# Patient Record
Sex: Female | Born: 1955 | Race: White | Hispanic: No | Smoking: Former smoker
Health system: Southern US, Community
[De-identification: ages and names within clinical notes are randomized; demographics above are authoritative.]

## PROBLEM LIST (undated history)

## (undated) DIAGNOSIS — K5792 Diverticulitis of intestine, part unspecified, without perforation or abscess without bleeding: Secondary | ICD-10-CM

## (undated) DIAGNOSIS — F330 Major depressive disorder, recurrent, mild: Secondary | ICD-10-CM

## (undated) DIAGNOSIS — J45909 Unspecified asthma, uncomplicated: Secondary | ICD-10-CM

## (undated) DIAGNOSIS — E8940 Asymptomatic postprocedural ovarian failure: Secondary | ICD-10-CM

## (undated) DIAGNOSIS — K219 Gastro-esophageal reflux disease without esophagitis: Secondary | ICD-10-CM

## (undated) DIAGNOSIS — M1990 Unspecified osteoarthritis, unspecified site: Secondary | ICD-10-CM

## (undated) DIAGNOSIS — F419 Anxiety disorder, unspecified: Secondary | ICD-10-CM

## (undated) DIAGNOSIS — R519 Headache, unspecified: Secondary | ICD-10-CM

## (undated) DIAGNOSIS — Z9889 Other specified postprocedural states: Secondary | ICD-10-CM

## (undated) DIAGNOSIS — M069 Rheumatoid arthritis, unspecified: Secondary | ICD-10-CM

## (undated) DIAGNOSIS — K5190 Ulcerative colitis, unspecified, without complications: Secondary | ICD-10-CM

## (undated) DIAGNOSIS — K5790 Diverticulosis of intestine, part unspecified, without perforation or abscess without bleeding: Secondary | ICD-10-CM

## (undated) DIAGNOSIS — E785 Hyperlipidemia, unspecified: Secondary | ICD-10-CM

## (undated) HISTORY — PX: ESOPHAGOGASTRODUODENOSCOPY: SHX1529

## (undated) HISTORY — PX: VAGINAL HYSTERECTOMY: SUR661

## (undated) HISTORY — PX: CARDIAC CATHETERIZATION: SHX172

## (undated) HISTORY — PX: COLONOSCOPY: SHX174

---

## 2004-05-14 ENCOUNTER — Ambulatory Visit: Payer: Self-pay | Admitting: Physician Assistant

## 2004-05-18 ENCOUNTER — Ambulatory Visit: Payer: Self-pay | Admitting: Internal Medicine

## 2004-08-07 ENCOUNTER — Ambulatory Visit: Payer: Self-pay | Admitting: Obstetrics and Gynecology

## 2005-08-27 ENCOUNTER — Ambulatory Visit: Payer: Self-pay | Admitting: Unknown Physician Specialty

## 2005-09-02 ENCOUNTER — Ambulatory Visit: Payer: Self-pay | Admitting: Unknown Physician Specialty

## 2006-04-26 ENCOUNTER — Ambulatory Visit: Payer: Self-pay | Admitting: Internal Medicine

## 2006-05-23 ENCOUNTER — Ambulatory Visit: Payer: Self-pay | Admitting: Psychiatry

## 2007-07-23 ENCOUNTER — Emergency Department: Payer: Self-pay | Admitting: Emergency Medicine

## 2008-08-30 ENCOUNTER — Ambulatory Visit: Payer: Self-pay | Admitting: Cardiology

## 2009-05-12 ENCOUNTER — Observation Stay: Payer: Self-pay | Admitting: Internal Medicine

## 2009-06-19 ENCOUNTER — Ambulatory Visit: Payer: Self-pay | Admitting: Internal Medicine

## 2009-06-25 ENCOUNTER — Ambulatory Visit: Payer: Self-pay | Admitting: Internal Medicine

## 2010-01-20 ENCOUNTER — Ambulatory Visit: Payer: Self-pay | Admitting: Internal Medicine

## 2012-12-15 ENCOUNTER — Ambulatory Visit: Payer: Self-pay | Admitting: Internal Medicine

## 2013-01-05 ENCOUNTER — Inpatient Hospital Stay: Payer: Self-pay | Admitting: Internal Medicine

## 2013-01-05 LAB — CBC WITH DIFFERENTIAL/PLATELET
Basophil #: 0.1 10*3/uL (ref 0.0–0.1)
Basophil %: 0.9 %
Eosinophil #: 0.1 10*3/uL (ref 0.0–0.7)
Eosinophil %: 0.8 %
HCT: 40.4 % (ref 35.0–47.0)
HGB: 13.8 g/dL (ref 12.0–16.0)
Lymphocyte #: 2.2 10*3/uL (ref 1.0–3.6)
Lymphocyte %: 19.2 %
MCH: 32.8 pg (ref 26.0–34.0)
MCHC: 34.2 g/dL (ref 32.0–36.0)
MCV: 96 fL (ref 80–100)
MONOS PCT: 11.3 %
Monocyte #: 1.3 x10 3/mm — ABNORMAL HIGH (ref 0.2–0.9)
Neutrophil #: 7.7 10*3/uL — ABNORMAL HIGH (ref 1.4–6.5)
Neutrophil %: 67.8 %
Platelet: 166 10*3/uL (ref 150–440)
RBC: 4.22 10*6/uL (ref 3.80–5.20)
RDW: 11.9 % (ref 11.5–14.5)
WBC: 11.4 10*3/uL — AB (ref 3.6–11.0)

## 2013-01-05 LAB — COMPREHENSIVE METABOLIC PANEL
ALBUMIN: 3.4 g/dL (ref 3.4–5.0)
ALT: 22 U/L (ref 12–78)
ANION GAP: 3 — AB (ref 7–16)
AST: 21 U/L (ref 15–37)
Alkaline Phosphatase: 99 U/L
BUN: 7 mg/dL (ref 7–18)
Bilirubin,Total: 0.5 mg/dL (ref 0.2–1.0)
CALCIUM: 9.4 mg/dL (ref 8.5–10.1)
CHLORIDE: 105 mmol/L (ref 98–107)
CREATININE: 0.71 mg/dL (ref 0.60–1.30)
Co2: 29 mmol/L (ref 21–32)
EGFR (Non-African Amer.): >60
Glucose: 94 mg/dL (ref 65–99)
Osmolality: 272 (ref 275–301)
Potassium: 3.2 mmol/L — ABNORMAL LOW (ref 3.5–5.1)
SODIUM: 137 mmol/L (ref 136–145)
Total Protein: 7.2 g/dL (ref 6.4–8.2)

## 2013-01-05 LAB — TSH: Thyroid Stimulating Horm: 0.56 u[IU]/mL

## 2013-01-05 LAB — SEDIMENTATION RATE: Erythrocyte Sed Rate: 4 mm/hr (ref 0–30)

## 2013-01-06 LAB — URINALYSIS, COMPLETE
BILIRUBIN, UR: NEGATIVE
Bacteria: NONE SEEN
Glucose,UR: NEGATIVE mg/dL (ref 0–75)
KETONE: NEGATIVE
Leukocyte Esterase: NEGATIVE
Nitrite: NEGATIVE
Ph: 6 (ref 4.5–8.0)
Protein: NEGATIVE
RBC,UR: 4 /HPF (ref 0–5)
SPECIFIC GRAVITY: 1.01 (ref 1.003–1.030)
Squamous Epithelial: 9
WBC UR: <1 /HPF (ref 0–5)

## 2013-01-06 LAB — WBCS, STOOL

## 2013-01-06 LAB — CLOSTRIDIUM DIFFICILE(ARMC)

## 2013-01-07 LAB — CBC WITH DIFFERENTIAL/PLATELET
Basophil #: 0 10*3/uL (ref 0.0–0.1)
Basophil %: 0.7 %
Eosinophil #: 0.2 10*3/uL (ref 0.0–0.7)
Eosinophil %: 3.9 %
HCT: 33.4 % — AB (ref 35.0–47.0)
HGB: 11.4 g/dL — AB (ref 12.0–16.0)
Lymphocyte #: 1.2 10*3/uL (ref 1.0–3.6)
Lymphocyte %: 19.7 %
MCH: 32.6 pg (ref 26.0–34.0)
MCHC: 34.2 g/dL (ref 32.0–36.0)
MCV: 95 fL (ref 80–100)
MONOS PCT: 12.9 %
Monocyte #: 0.8 x10 3/mm (ref 0.2–0.9)
NEUTROS PCT: 62.8 %
Neutrophil #: 3.8 10*3/uL (ref 1.4–6.5)
Platelet: 141 10*3/uL — ABNORMAL LOW (ref 150–440)
RBC: 3.5 10*6/uL — ABNORMAL LOW (ref 3.80–5.20)
RDW: 11.8 % (ref 11.5–14.5)
WBC: 6 10*3/uL (ref 3.6–11.0)

## 2013-01-07 LAB — COMPREHENSIVE METABOLIC PANEL
Albumin: 2.5 g/dL — ABNORMAL LOW (ref 3.4–5.0)
Alkaline Phosphatase: 70 U/L
Anion Gap: 4 — ABNORMAL LOW (ref 7–16)
BUN: 5 mg/dL — AB (ref 7–18)
Bilirubin,Total: 0.3 mg/dL (ref 0.2–1.0)
CALCIUM: 8.4 mg/dL — AB (ref 8.5–10.1)
CHLORIDE: 112 mmol/L — AB (ref 98–107)
Co2: 25 mmol/L (ref 21–32)
Creatinine: 0.73 mg/dL (ref 0.60–1.30)
EGFR (Non-African Amer.): >60
Glucose: 87 mg/dL (ref 65–99)
OSMOLALITY: 278 (ref 275–301)
Potassium: 3.6 mmol/L (ref 3.5–5.1)
SGOT(AST): 22 U/L (ref 15–37)
SGPT (ALT): 16 U/L (ref 12–78)
Sodium: 141 mmol/L (ref 136–145)
Total Protein: 5.5 g/dL — ABNORMAL LOW (ref 6.4–8.2)

## 2013-01-07 LAB — OCCULT BLOOD X 1 CARD TO LAB, STOOL: Occult Blood, Feces: NEGATIVE

## 2013-01-08 LAB — COMPREHENSIVE METABOLIC PANEL
ALK PHOS: 72 U/L
ALT: 16 U/L (ref 12–78)
Albumin: 2.5 g/dL — ABNORMAL LOW (ref 3.4–5.0)
Anion Gap: 4 — ABNORMAL LOW (ref 7–16)
BILIRUBIN TOTAL: 0.4 mg/dL (ref 0.2–1.0)
BUN: 6 mg/dL — ABNORMAL LOW (ref 7–18)
CHLORIDE: 111 mmol/L — AB (ref 98–107)
CO2: 25 mmol/L (ref 21–32)
CREATININE: 0.72 mg/dL (ref 0.60–1.30)
Calcium, Total: 8.5 mg/dL (ref 8.5–10.1)
EGFR (Non-African Amer.): >60
Glucose: 88 mg/dL (ref 65–99)
Osmolality: 276 (ref 275–301)
Potassium: 3.6 mmol/L (ref 3.5–5.1)
SGOT(AST): 18 U/L (ref 15–37)
Sodium: 140 mmol/L (ref 136–145)
Total Protein: 5.5 g/dL — ABNORMAL LOW (ref 6.4–8.2)

## 2013-01-08 LAB — STOOL CULTURE

## 2013-01-08 LAB — CBC WITH DIFFERENTIAL/PLATELET
BASOS PCT: 0.9 %
Basophil #: 0.1 10*3/uL (ref 0.0–0.1)
EOS ABS: 0.3 10*3/uL (ref 0.0–0.7)
EOS PCT: 5.2 %
HCT: 34.2 % — AB (ref 35.0–47.0)
HGB: 11.7 g/dL — ABNORMAL LOW (ref 12.0–16.0)
Lymphocyte #: 1.9 10*3/uL (ref 1.0–3.6)
Lymphocyte %: 30.7 %
MCH: 33.1 pg (ref 26.0–34.0)
MCHC: 34.3 g/dL (ref 32.0–36.0)
MCV: 97 fL (ref 80–100)
MONO ABS: 0.8 x10 3/mm (ref 0.2–0.9)
MONOS PCT: 12.5 %
Neutrophil #: 3.1 10*3/uL (ref 1.4–6.5)
Neutrophil %: 50.7 %
PLATELETS: 147 10*3/uL — AB (ref 150–440)
RBC: 3.55 10*6/uL — AB (ref 3.80–5.20)
RDW: 11.7 % (ref 11.5–14.5)
WBC: 6.1 10*3/uL (ref 3.6–11.0)

## 2013-01-09 LAB — PATHOLOGY REPORT

## 2013-01-31 ENCOUNTER — Other Ambulatory Visit: Payer: Self-pay | Admitting: Gastroenterology

## 2013-01-31 LAB — CLOSTRIDIUM DIFFICILE(ARMC)

## 2013-02-03 LAB — STOOL CULTURE

## 2013-03-30 ENCOUNTER — Ambulatory Visit: Payer: Self-pay | Admitting: Gastroenterology

## 2013-04-02 LAB — PATHOLOGY REPORT

## 2014-03-06 ENCOUNTER — Ambulatory Visit: Payer: Self-pay | Admitting: Obstetrics and Gynecology

## 2014-04-19 ENCOUNTER — Other Ambulatory Visit: Payer: Self-pay | Admitting: Obstetrics and Gynecology

## 2014-04-19 DIAGNOSIS — N63 Unspecified lump in unspecified breast: Secondary | ICD-10-CM

## 2014-04-27 NOTE — H&P (Signed)
PATIENT NAME:  Nicole Turner, Nicole Turner MR#:  [id number] DATE OF BIRTH:  1955-01-30  DATE OF ADMISSION:  01/05/2013  HISTORY OF PRESENT ILLNESS: The patient is a 59 year old female presenting with lower GI bleed and severe abdominal pain with inflammatory colitis, unresponsive to outpatient therapy. She has been sick for essentially 1 month. She saw me approximately 1 month ago with abdominal pain, diarrhea, some blood in it. At that time, C. diff was negative. An abdominopelvic CT scan was unremarkable but her symptoms were profound. She was placed on Levaquin and Flagyl for 10 days with low-dose prednisone. Her symptoms improved slightly with b.i.d. Protonix as well;  however, they persisted. Carafate was added to help her ameliorate her symptomatology. However, now, 1 month later, she has lost 10 to 15 pounds. She is hardly eating anything. All stools are mixed with blood. No fever or chills. Profound abdominal pain noted. She is very lightheaded when she stands up. Can really not keep anything down hardly.   In light of her progressive decline and symptoms as above, unresponsive to outpatient therapy, she is being admitted for further evaluation and treatment.   PAST MEDICAL HISTORY AND MEDICAL ILLNESSES:  1.  Mitral valve prolapse/MR.  2.  History of daily headaches.  3.  Rheumatoid arthritis, erosive, status post methotrexate.  4.  History of osteoarthritis.     PAST SURGICAL HISTORY: Hysterectomy, BTL.   ALLERGIES: SALICYLATES, TRAMADOL.   MEDICATIONS: Enjuvia 1.25 mg daily, lovastatin 20 mg daily, citalopram 20 mg daily.   SOCIAL HISTORY: Remarried. No smoking or alcohol.   FAMILY HISTORY: Noncontributory.   REVIEW OF SYSTEMS: As noted above.   PHYSICAL EXAMINATION:  VITAL SIGNS: Blood pressure 110/60 lying, 90/50 standing. Pulse goes from 80 to 100. Weight is 135, down 10 pounds from a month ago.  NECK: No thyromegaly or bruits.  HEART: Regular rate and rhythm.  No audible murmur.   LUNGS: Clear to auscultation and percussion.  ABDOMEN: Severe diffuse tenderness, mostly in the left lower quadrant, questionable rebound. Soft.  EXTREMITIES: No edema.  SKIN: No rash.  NEUROLOGIC: Grossly nonfocal.   ASSESSMENT AND PLAN:  1.  Severe abdominal pain - associated inflammatory colitis with gastrointestinal bleed. Consult gastroenterology. Likely needs colonoscopy. Send off stool for culture and sensitivity. WBC, Clostridium difficile, toxin, ova and parasites. Empiric Levaquin and Flagyl. Abdominal pelvic CT with contrast to evaluate further.  2.  Volume depletion - IV normal saline in light of her orthostatic symptoms.  3.  Profound epigastric pain - IV Protonix. Ultimately she may need steroids but will see gastroenterology's  thoughts. Very well could be either infectious versus autoimmune inflammatory colitis.   ____________________________ Danella Penton, MD mfm:np D: 01/05/2013 18:03:17 ET T: 01/05/2013 18:22:59 ET JOB#: [id number]  cc: Danella Penton, MD, <Dictator> Khylan Sawyer Sherlene Shams MD ELECTRONICALLY SIGNED 01/08/2013 8:18

## 2014-04-27 NOTE — Consult Note (Signed)
Chief Complaint:  Subjective/Chief Complaint continues with diarrhea though less frequent, no n/v, tolerating clears.   VITAL SIGNS/ANCILLARY NOTES: **Vital Signs.:   04-Jan-15 05:55  Vital Signs Type Routine  Temperature Temperature (F) 98.3  Celsius 36.8  Temperature Source oral  Pulse Pulse 67  Respirations Respirations 20  Systolic BP Systolic BP 161  Diastolic BP (mmHg) Diastolic BP (mmHg) 63  Mean BP 76  Pulse Ox % Pulse Ox % 94  Pulse Ox Activity Level  At rest  Oxygen Delivery Room Air/ 21 %  *Intake and Output.:   04-Jan-15 01:19  Stool  Small watery reddish stool per pt.    06:00  Stool  Watery stool per pt.   Brief Assessment:  Cardiac Regular   Respiratory clear BS   Gastrointestinal details normal Soft  Bowel sounds normal  No rebound tenderness  mild tenderness to palpation on the left abdomen, mostly llq   Additional Physical Exam DRE-heme negative clear/white mucus.   Lab Results: Hepatic:  04-Jan-15 05:58   Bilirubin, Total 0.3  Alkaline Phosphatase 70 (45-117 NOTE: New Reference Range 11/24/12)  SGPT (ALT) 16  SGOT (AST) 22  Total Protein, Serum  5.5  Albumin, Serum  2.5  Routine Micro:  02-Jan-15 21:07   Micro Text Report CLOSTRIDIUM DIFFICILE   C.DIFFICILE ANTIGEN       C.DIFFICILE GDH ANTIGEN : POSITIVE   C.DIFFICILE TOXIN A/B     C.DIFFICILE TOXINS A AND B : NEGATIVE   PCR FOR TOXIGENIC C.DIFF  PCR FOR TOXIGENIC C.DIFFICILE : POSITIVE   INTERPRETATION Positive for toxigenic C. difficile, active toxin production NOT detected.  Patient has toxigenic C. difficile organisms present in the bowel, but toxin was not detected.  The patient may be a carrier or the level of toxin in the sample was below the limit of detection. This information should be used in conjunction with the patient's clinical history when deciding on possible therapy.   ANTIBIOTIC                       Routine Chem:  04-Jan-15 05:58   Glucose, Serum 87  BUN  5   Creatinine (comp) 0.73  Sodium, Serum 141  Potassium, Serum 3.6  Chloride, Serum  112  CO2, Serum 25  Calcium (Total), Serum  8.4  Osmolality (calc) 278  eGFR (African American) >60  eGFR (Non-African American) >60 (eGFR values <57m/min/1.73 m2 may be an indication of chronic kidney disease (CKD). Calculated eGFR is useful in patients with stable renal function. The eGFR calculation will not be reliable in acutely ill patients when serum creatinine is changing rapidly. It is not useful in  patients on dialysis. The eGFR calculation may not be applicable to patients at the low and high extremes of body sizes, pregnant women, and vegetarians.)  Anion Gap  4  Routine Hem:  04-Jan-15 05:58   WBC (CBC) 6.0  RBC (CBC)  3.50  Hemoglobin (CBC)  11.4  Hematocrit (CBC)  33.4  Platelet Count (CBC)  141  MCV 95  MCH 32.6  MCHC 34.2  RDW 11.8  Neutrophil % 62.8  Lymphocyte % 19.7  Monocyte % 12.9  Eosinophil % 3.9  Basophil % 0.7  Neutrophil # 3.8  Lymphocyte # 1.2  Monocyte # 0.8  Eosinophil # 0.2  Basophil # 0.0 (Result(s) reported on 07 Jan 2013 at 06:19AM.)   Radiology Results: CT:    02-Jan-15 21:49, CT Abdomen and Pelvis With Contrast  CT Abdomen  and Pelvis With Contrast   REASON FOR EXAM:    (1) DIFFUSE SEVERE PAIN; (2) DIFFUSE SEVERE PAIN  COMMENTS:       PROCEDURE: CT  - CT ABDOMEN / PELVIS  W  - Jan 05 2013  9:49PM     CLINICAL DATA:  Abdominal pain and GI bleeding.    EXAM:  CT ABDOMEN AND PELVIS WITH CONTRAST    TECHNIQUE:  Multidetector CT imaging of the abdomen and pelvis was performed  using the standard protocol following bolus administration of  intravenous contrast.  CONTRAST:  100 cc Isovue 370 intravenous    COMPARISON:  12/15/2012    FINDINGS:  BODY WALL: Unremarkable.    LOWER CHEST: RCA atherosclerosis.    ABDOMEN/PELVIS:    Liver: No focal abnormality.    Biliary: No evidence of biliary obstruction or stone.  Pancreas:  Unremarkable.    Spleen: Unremarkable.    Adrenals: Unremarkable.    Kidneys and ureters: No hydronephrosis or stone.    Bladder: Unremarkable.    Reproductive: Hysterectomy.    Bowel: Distal colonic diverticulosis, which may account for mild  diffuse sigmoid wall thickening. No bowel obstruction. Normal  appendix.  Retroperitoneum: No mass or adenopathy.    Peritoneum: No free fluid or gas.    Vascular: No acute abnormality.    OSSEOUS: No acute abnormalities.     IMPRESSION:  1. No evidence of acute intra-abdominal disease.  2. Colonic diverticulosis.  3. Atherosclerosis, including the coronary arteries.      Electronically Signed    By: Jorje Guild M.D.    On: 01/06/2013 01:24         Verified By: Gilford Silvius, M.D.,   Assessment/Plan:  Assessment/Plan:  Assessment 1) diarrheal illness-positive c. diff., on po flagyl and levoquin.  d/c levoquin continue flagyl.  will arrange for flexible sigmoidoscopy tomorrow am.  I have discussed the risks benefits and complicatiosn of flexible sigmoidoscopy to include not limited to bleeding infection perforation and sedation and she wishes to proceed.   Plan as above.   Electronic Signatures: Loistine Simas (MD)  (Signed 04-Jan-15 14:00)  Authored: Chief Complaint, VITAL SIGNS/ANCILLARY NOTES, Brief Assessment, Lab Results, Radiology Results, Assessment/Plan   Last Updated: 04-Jan-15 14:00 by Loistine Simas (MD)

## 2014-04-27 NOTE — Consult Note (Signed)
PATIENT NAME:  Nicole Turner, Nicole Turner MR#:  [id number] DATE OF BIRTH:  01-Sep-1955  DATE OF CONSULTATION:  01/06/2013  REFERRING PHYSICIAN:   CONSULTING PHYSICIAN:  Christena Deem, MD  HER PHYSICIANS:  Bethann Punches, MD and Aletha Halim, MD  REASON FOR CONSULTATION:  Abdominal pain  rectal bleeding  diarrheal illness.   HISTORY OF PRESENT ILLNESS:  Nicole Turner is a pleasant 59 year old Caucasian female who was in her usual state of health until about 6 weeks ago. At that time she stated she had an episode of gas cramping and then followed by passing some mucus and blood from her rectum. This was accompanied with some generalized abdominal pain. She went to see her primary physician who placed her on a course of what sounds to be Cipro and Flagyl as well as a low dose of prednisone. She was also placed on a PPI. She seemed to do a bit better, but then a couple of weeks later was treated with another course of antibiotic, she is uncertain what that may have been, for an upper respiratory tract infection. She stated that the diarrhea actually started more so about a week after the initial episode. She has been nauseated but no vomiting. Currently mostly lower abdominal pain over the course of the past month or so. There have been no stool studies done outpatient. Apparently, however, stool studies have been collected on this hospitalization. The culture and sensitivities are pending; however, she is positive for C. difficile. She is currently on Levaquin as well as Flagyl. Currently, she states that she is feeling a little bit better, having started this regimen yesterday. Her lower abdominal pain is about a 4/10, centered mostly in the left lower quadrant. There is no nausea or vomiting. She states her last bowel movement was couple of hours ago, this still being watery. The patient has not had any contact with either nursing home patients, previous C. difficile. 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   GASTROINTESTINAL FAMILY HISTORY:  Negative for colorectal cancer, liver disease or ulcers.   PAST MEDICAL HISTORY:  1.  Mitral valve prolapse/mitral regurgitation. She states that she had 2 episodes of rheumatic fever as a child. She has a history of the heart murmur as noted.  2.  She has a history of daily headaches.  3.  She has rheumatoid arthritis for which she has taken methotrexate in the past. Also a history of osteoarthritis.  4.  A history of hysterectomy and previous to that bilateral tubal ligation.   ALLERGIES:  She is allergic to AMOXICILLIN and TRAMADOL.   OUTPATIENT MEDICATIONS: 1.  Carafate 1 gram t.i.d. 2.  Citalopram 20 mg 1-1/2 tablets once a day. 3.  Enjuvia 1.25 mg tablet once a day. 4.  Lovastatin 40 mg once a day.  5.  Pantoprazole 40 mg once a day.  6.  There is apparently a history of her taking omeprazole as an outpatient. I am uncertain as to how long ago that was.   REVIEW OF SYSTEMS:  Negative with the exception of above.   PHYSICAL EXAMINATION:  VITAL SIGNS: Temperature is 97.7, pulse 72, respirations 18, blood pressure 103/61, pulse ox 93%.  GENERAL:  She is a 59 year old Caucasian female, no acute distress.  HEENT:  Normocephalic, atraumatic.  EYES:  Anicteric.  NOSE:  Septum midline. No lesions.  OROPHARYNX:  No lesions.  NECK:  Supple. No JVD.  HEART:  Regular rate and rhythm.  LUNGS:  Clear.  ABDOMEN:  Soft. There is some mild tenderness to palpation in the left lower quadrant. There are no masses, rebound or organomegaly. Bowel sounds positive, normoactive.  ANORECTAL:  Deferred.  EXTREMITIES:  No clubbing, cyanosis or edema to 0.5 trace edema.  NEUROLOGICAL:  Cranial nerves II through XII grossly intact. Muscle strength bilaterally equal and symmetric. DTRs bilaterally equal and symmetric.   LABORATORIES FROM ADMISSION YESTERDAY AT 1923 HOURS SHOWED:  A glucose of 94, BUN 7, creatinine 0.71, sodium 137, potassium 3.2,  chloride 105, bicarb 29, calcium 9.4. Her hepatic profile was normal. Her TSH was normal at 0.5. Hemogram showing a white count of 11.4, sedimentation rate 4, H and H 13.8/40.4, platelet count 166, MCV is 96. C. diff. positive. Stool culture and sensitivity being held for further evaluation. Urinalysis showed 1+ blood, negative otherwise. She had a CT scan of the abdomen and pelvis with contrast for abdominal pain. This showing no evidence of acute intra-abdominal disease, colonic diverticulosis with some possible mild diffuse sigmoid wall thickening, no evidence of bowel obstruction, normal appendix and some mild atherosclerosis in the coronary arteries.   ASSESSMENT:  Diarrheal illness. The patient has had multiple courses of antibiotics over the past 6 weeks. She is currently Clostridium difficile positive and is on metronidazole. However, she is also on Levaquin. She did have some stool collected for culture on admission and the culture and sensitivity is still pending. She is feeling a bit better than that on admission. It is difficult to tell whether the current situation is connected with her initial presentation or whether the Clostridium difficile is the result of the previous treatment with several courses of antibiotics.   RECOMMENDATIONS:  1.  Continue current antibiotics until we get the cultures back. If the cultures are uninformative otherwise, would discontinue the Levaquin and continue the Flagyl. Would consider changing that to oral 500 mg p.o. t.i.d.  2.  The patient will need a luminal evaluation via colonoscopy at some point; however, with a positive Clostridium difficile, would hold on that for now. If she does not seem to improve over the next couple of days, would consider a flexible sigmoidoscopy.   Addendum:  In review, the patient had an EGD and colonoscopy on 09/02/2005. The EGD being done for epigastric pain and dysphagia with findings of hiatal hernia, some mild gastritis and  esophagitis. Apparently, that was what she was put on the Prilosec for years ago. Her colonoscopy was done for rectal bleeding with the finding of diverticulosis and internal hemorrhoids, negative otherwise. The biopsies were negative from both the upper and the lower for other causes.   Will follow with you. Thank you for this consult.   ____________________________ Christena Deem, MD mus:jm D: 01/06/2013 12:59:00 ET T: 01/06/2013 13:24:41 ET JOB#: [id number]  cc: Christena Deem, MD, <Dictator> Christena Deem MD ELECTRONICALLY SIGNED 01/06/2013 15:31

## 2014-04-27 NOTE — Consult Note (Signed)
Brief Consult Note: Diagnosis: diarrheal illness, abdominal pain, positive C. diff.   Patient was seen by consultant.   Consult note dictated.   Recommend further assessment or treatment.   Comments: Please see full GI consult [phone].  Patietn admitted with abdominal pain and nausea with copious diarrhea.  Found to have positive C. diff organism but not toxin.  Currently felling a little better on levaquin and flagyl.  Stool culture pending result for other causative etiology.  Last colonoscopy 08/2005 with finding of sigmoid diverticulosis and internal hemorrhoids.  Etiology of current illness probably c. diff after several courses of antibiotics, although the initial presentation may not have been (question of diverticulitis? at that time).  Continue current treatment and await final on stool cultures.  If no other organism, discontinue the levaquin, continue flagyl, change to po.  Patient will need luminal evaluation via colonoscopy, but would be better to wait resolution of this illness episode.  If there is no improvement, will do flex sig on monday or so.   Following.  Electronic Signatures: Barnetta Chapel (MD)  (Signed 03-Jan-15 13:06)  Authored: Brief Consult Note   Last Updated: 03-Jan-15 13:06 by Barnetta Chapel (MD)

## 2014-04-27 NOTE — Discharge Summary (Signed)
PATIENT NAME:  Nicole Turner, Nicole Turner MR#:  [id number] DATE OF BIRTH:  1955/09/07  DATE OF ADMISSION:  01/05/2013 DATE OF DISCHARGE:  01/09/2013  DISCHARGE DIAGNOSES:  1.  Pseudomembranous colitis.  2.  Dehydration.  3.  Erosive rheumatoid arthritis.  4.  Gastritis.   DISCHARGE MEDICATIONS: Enjuvia 1.25 mg daily, lovastatin 40 mg at bedtime, Pantoprazole 40 mg daily, metronidazole 500 mg t.i.d. x 10 days and Imipramine 25 mg at bedtime and Florastor 250 mg b.i.d.   REASON FOR ADMISSION: A 59 year old female presents with severe abdominal pain and bloody diarrhea. Please see H and P for HPI, past medical history and physical exam.   HOSPITAL COURSE: The patient was admitted, hydrated aggressively, found to be C. difficile positive, was treated with IV then p.o. Flagyl. Her symptoms did improve. Sigmoidoscopy was consistent with pseudomembranous colitis. She will be on 10 days of Flagyl. Ultimately wean that off. Continue the Florastor. Will follow up with Dr. Marva Panda in 2 weeks for consideration of full colonoscopy and endoscopy.  ____________________________ Danella Penton, MD mfm:aw D: 01/09/2013 07:26:51 ET T: 01/09/2013 07:55:55 ET JOB#: [id number]  cc: Danella Penton, MD, <Dictator> Rhiannan Kievit Sherlene Shams MD ELECTRONICALLY SIGNED 01/10/2013 8:04

## 2014-04-27 NOTE — Consult Note (Signed)
Chief Complaint:  Subjective/Chief Complaint seen for abdominal pain and diarrheal illness.  feelng some better today, no n/v  abd pain is 4/10 improving.  no bm since yesterday.   VITAL SIGNS/ANCILLARY NOTES: **Vital Signs.:   05-Jan-15 04:00  Vital Signs Type Routine  Temperature Temperature (F) 97.8  Celsius 36.5  Temperature Source oral  Pulse Pulse 55  Respirations Respirations 20  Systolic BP Systolic BP 295  Diastolic BP (mmHg) Diastolic BP (mmHg) 71  Mean BP 87  Pulse Ox % Pulse Ox % 92  Pulse Ox Activity Level  At rest  Oxygen Delivery Room Air/ 21 %   Brief Assessment:  Cardiac Regular   Respiratory clear BS   Gastrointestinal details normal Soft  Nontender  Nondistended  No masses palpable  Bowel sounds normal   Lab Results: Hepatic:  05-Jan-15 03:48   Bilirubin, Total 0.4  Alkaline Phosphatase 72 (45-117 NOTE: New Reference Range 11/24/12)  SGPT (ALT) 16  SGOT (AST) 18  Total Protein, Serum  5.5  Albumin, Serum  2.5  Routine Chem:  05-Jan-15 03:48   Glucose, Serum 88  BUN  6  Creatinine (comp) 0.72  Sodium, Serum 140  Potassium, Serum 3.6  Chloride, Serum  111  CO2, Serum 25  Calcium (Total), Serum 8.5  Osmolality (calc) 276  eGFR (African American) >60  eGFR (Non-African American) >60 (eGFR values <38m/min/1.73 m2 may be an indication of chronic kidney disease (CKD). Calculated eGFR is useful in patients with stable renal function. The eGFR calculation will not be reliable in acutely ill patients when serum creatinine is changing rapidly. It is not useful in  patients on dialysis. The eGFR calculation may not be applicable to patients at the low and high extremes of body sizes, pregnant women, and vegetarians.)  Anion Gap  4  Routine Sero:  04-Jan-15 13:09   Occult Blood, Feces NEGATIVE (Result(s) reported on 07 Jan 2013 at 02:13PM.)  Routine Hem:  05-Jan-15 03:48   WBC (CBC) 6.1  RBC (CBC)  3.55  Hemoglobin (CBC)  11.7  Hematocrit  (CBC)  34.2  Platelet Count (CBC)  147  MCV 97  MCH 33.1  MCHC 34.3  RDW 11.7  Neutrophil % 50.7  Lymphocyte % 30.7  Monocyte % 12.5  Eosinophil % 5.2  Basophil % 0.9  Neutrophil # 3.1  Lymphocyte # 1.9  Monocyte # 0.8  Eosinophil # 0.3  Basophil # 0.1 (Result(s) reported on 08 Jan 2013 at 04:40AM.)   Radiology Results: CT:    02-Jan-15 21:49, CT Abdomen and Pelvis With Contrast  CT Abdomen and Pelvis With Contrast   REASON FOR EXAM:    (1) DIFFUSE SEVERE PAIN; (2) DIFFUSE SEVERE PAIN  COMMENTS:       PROCEDURE: CT  - CT ABDOMEN / PELVIS  W  - Jan 05 2013  9:49PM     CLINICAL DATA:  Abdominal pain and GI bleeding.    EXAM:  CT ABDOMEN AND PELVIS WITH CONTRAST    TECHNIQUE:  Multidetector CT imaging of the abdomen and pelvis was performed  using the standard protocol following bolus administration of  intravenous contrast.  CONTRAST:  100 cc Isovue 370 intravenous    COMPARISON:  12/15/2012    FINDINGS:  BODY WALL: Unremarkable.    LOWER CHEST: RCA atherosclerosis.    ABDOMEN/PELVIS:    Liver: No focal abnormality.    Biliary: No evidence of biliary obstruction or stone.  Pancreas: Unremarkable.    Spleen: Unremarkable.  Adrenals: Unremarkable.    Kidneys and ureters: No hydronephrosis or stone.    Bladder: Unremarkable.    Reproductive: Hysterectomy.    Bowel: Distal colonic diverticulosis, which may account for mild  diffuse sigmoid wall thickening. No bowel obstruction. Normal  appendix.  Retroperitoneum: No mass or adenopathy.    Peritoneum: No free fluid or gas.    Vascular: No acute abnormality.    OSSEOUS: No acute abnormalities.     IMPRESSION:  1. No evidence of acute intra-abdominal disease.  2. Colonic diverticulosis.  3. Atherosclerosis, including the coronary arteries.      Electronically Signed    By: Jorje Guild M.D.    On: 01/06/2013 01:24         Verified By: Gilford Silvius, M.D.,   Assessment/Plan:   Assessment/Plan:  Assessment 1) diarrheal illness, previous rectal bleeding.  positive c diff.   Plan 1) flexible sigmoidoscopy today.  I have discussed the risks benefits and complications of flexible sigmoidoscopy to include not limited to bleeding infection perforation and sedation and she wishes to proceed.  further recs to follow.   Electronic Signatures: Loistine Simas (MD)  (Signed 05-Jan-15 10:29)  Authored: Chief Complaint, VITAL SIGNS/ANCILLARY NOTES, Brief Assessment, Lab Results, Radiology Results, Assessment/Plan   Last Updated: 05-Jan-15 10:29 by Loistine Simas (MD)

## 2014-09-10 ENCOUNTER — Other Ambulatory Visit: Payer: Self-pay

## 2015-10-30 ENCOUNTER — Ambulatory Visit
Admission: RE | Admit: 2015-10-30 | Discharge: 2015-10-30 | Disposition: A | Discharge status: Home / Self Care | Payer: BC Managed Care – PPO | Source: Ambulatory Visit | Attending: Internal Medicine | Admitting: Internal Medicine

## 2015-10-30 ENCOUNTER — Other Ambulatory Visit: Payer: Self-pay | Admitting: Internal Medicine

## 2015-10-30 DIAGNOSIS — I700 Atherosclerosis of aorta: Secondary | ICD-10-CM | POA: Diagnosis not present

## 2015-10-30 DIAGNOSIS — R1084 Generalized abdominal pain: Secondary | ICD-10-CM

## 2015-10-30 DIAGNOSIS — I2510 Atherosclerotic heart disease of native coronary artery without angina pectoris: Secondary | ICD-10-CM | POA: Insufficient documentation

## 2015-10-30 DIAGNOSIS — K529 Noninfective gastroenteritis and colitis, unspecified: Secondary | ICD-10-CM | POA: Insufficient documentation

## 2015-10-30 HISTORY — DX: Unspecified asthma, uncomplicated: J45.909

## 2015-10-30 MED ORDER — IOPAMIDOL (ISOVUE-300) INJECTION 61%
100.0000 mL | Freq: Once | INTRAVENOUS | Status: AC | PRN
  Administered 2015-10-30: 100 mL via INTRAVENOUS

## 2015-11-19 ENCOUNTER — Encounter: Payer: Self-pay | Admitting: *Deleted

## 2015-11-20 ENCOUNTER — Telehealth (INDEPENDENT_AMBULATORY_CARE_PROVIDER_SITE_OTHER): Payer: Self-pay | Admitting: Vascular Surgery

## 2015-11-20 ENCOUNTER — Ambulatory Visit: Payer: BC Managed Care – PPO | Admitting: Anesthesiology

## 2015-11-20 ENCOUNTER — Encounter
Admission: RE | Disposition: A | Discharge status: Home / Self Care | Payer: Self-pay | Source: Ambulatory Visit | Attending: Gastroenterology

## 2015-11-20 ENCOUNTER — Ambulatory Visit
Admission: RE | Admit: 2015-11-20 | Discharge: 2015-11-20 | Disposition: A | Discharge status: Home / Self Care | Payer: BC Managed Care – PPO | Source: Ambulatory Visit | Attending: Gastroenterology | Admitting: Gastroenterology

## 2015-11-20 ENCOUNTER — Encounter: Payer: Self-pay | Admitting: *Deleted

## 2015-11-20 DIAGNOSIS — K641 Second degree hemorrhoids: Secondary | ICD-10-CM | POA: Diagnosis not present

## 2015-11-20 DIAGNOSIS — I2510 Atherosclerotic heart disease of native coronary artery without angina pectoris: Secondary | ICD-10-CM | POA: Insufficient documentation

## 2015-11-20 DIAGNOSIS — E785 Hyperlipidemia, unspecified: Secondary | ICD-10-CM | POA: Insufficient documentation

## 2015-11-20 DIAGNOSIS — J45909 Unspecified asthma, uncomplicated: Secondary | ICD-10-CM | POA: Diagnosis not present

## 2015-11-20 DIAGNOSIS — K625 Hemorrhage of anus and rectum: Secondary | ICD-10-CM | POA: Insufficient documentation

## 2015-11-20 DIAGNOSIS — M1990 Unspecified osteoarthritis, unspecified site: Secondary | ICD-10-CM | POA: Insufficient documentation

## 2015-11-20 DIAGNOSIS — K644 Residual hemorrhoidal skin tags: Secondary | ICD-10-CM | POA: Insufficient documentation

## 2015-11-20 DIAGNOSIS — K5730 Diverticulosis of large intestine without perforation or abscess without bleeding: Secondary | ICD-10-CM | POA: Diagnosis not present

## 2015-11-20 DIAGNOSIS — Z79899 Other long term (current) drug therapy: Secondary | ICD-10-CM | POA: Insufficient documentation

## 2015-11-20 DIAGNOSIS — K649 Unspecified hemorrhoids: Secondary | ICD-10-CM

## 2015-11-20 HISTORY — PX: COLONOSCOPY WITH PROPOFOL: SHX5780

## 2015-11-20 HISTORY — DX: Unspecified hemorrhoids: K64.9

## 2015-11-20 HISTORY — DX: Hyperlipidemia, unspecified: E78.5

## 2015-11-20 HISTORY — DX: Asymptomatic postprocedural ovarian failure: E89.40

## 2015-11-20 HISTORY — DX: Unspecified osteoarthritis, unspecified site: M19.90

## 2015-11-20 LAB — CBC WITH DIFFERENTIAL/PLATELET
BASOS ABS: 0 10*3/uL (ref 0–0.1)
BASOS PCT: 1 %
EOS ABS: 0.2 10*3/uL (ref 0–0.7)
EOS PCT: 4 %
HCT: 39.4 % (ref 35.0–47.0)
Hemoglobin: 13.3 g/dL (ref 12.0–16.0)
Lymphocytes Relative: 32 %
Lymphs Abs: 1.7 10*3/uL (ref 1.0–3.6)
MCH: 32.4 pg (ref 26.0–34.0)
MCHC: 33.7 g/dL (ref 32.0–36.0)
MCV: 96.4 fL (ref 80.0–100.0)
MONO ABS: 0.7 10*3/uL (ref 0.2–0.9)
MONOS PCT: 13 %
Neutro Abs: 2.8 10*3/uL (ref 1.4–6.5)
Neutrophils Relative %: 50 %
PLATELETS: 173 10*3/uL (ref 150–440)
RBC: 4.09 MIL/uL (ref 3.80–5.20)
RDW: 12.9 % (ref 11.5–14.5)
WBC: 5.5 10*3/uL (ref 3.6–11.0)

## 2015-11-20 SURGERY — COLONOSCOPY WITH PROPOFOL
Anesthesia: General

## 2015-11-20 MED ORDER — PROPOFOL 10 MG/ML IV BOLUS
INTRAVENOUS | Status: DC | PRN
  Administered 2015-11-20: 50 mg via INTRAVENOUS

## 2015-11-20 MED ORDER — SODIUM CHLORIDE 0.9 % IV SOLN
INTRAVENOUS | Status: DC
Start: 2015-11-20 — End: 2015-11-20
  Administered 2015-11-20: 14:00:00 via INTRAVENOUS
  Administered 2015-11-20: 1000 mL via INTRAVENOUS
  Administered 2015-11-20: 14:00:00 via INTRAVENOUS

## 2015-11-20 MED ORDER — PHENYLEPHRINE HCL 10 MG/ML IJ SOLN
INTRAMUSCULAR | Status: DC | PRN
  Administered 2015-11-20: 200 ug via INTRAVENOUS
  Administered 2015-11-20 (×2): 100 ug via INTRAVENOUS

## 2015-11-20 MED ORDER — LIDOCAINE 2% (20 MG/ML) 5 ML SYRINGE
INTRAMUSCULAR | Status: DC | PRN
  Administered 2015-11-20: 60 mg via INTRAVENOUS

## 2015-11-20 MED ORDER — PROPOFOL 500 MG/50ML IV EMUL
INTRAVENOUS | Status: DC | PRN
  Administered 2015-11-20: 140 ug/kg/min via INTRAVENOUS

## 2015-11-20 MED ORDER — EPHEDRINE SULFATE 50 MG/ML IJ SOLN
INTRAMUSCULAR | Status: DC | PRN
  Administered 2015-11-20: 10 mg via INTRAVENOUS

## 2015-11-20 NOTE — H&P (Signed)
Outpatient short stay form Pre-procedure 11/20/2015 3:34 PM Nicole Deem MD  Primary Physician: Dr. Bethann Punches  Reason for visit:  Colonoscopy  History of present illness:  Patient is a 60 year old female presenting today as above. She has a recent history about 2 weeks ago of an episode of abdominal pain and rectal bleeding. She has had several further episodes of rectal bleeding since then. Her abdominal pain was generalized initially but continues in the upper abdomen. He did have a CT scan on 10/30/2015 showing a long segment wall thickening involving the descending and transverse colon. The descending and sigmoid were mildly thick-walled although under distended. There was some left colonic diverticulosis without diverticulitis. She was also noted to have some agent Faylene Million three-vessel coronary atherosclerosis. Reading of the CT scan noted atherosclerotic calcifications of the abdominal aorta and branch vessels. There is no comment on the mesenteric specifically. I did discuss with radiology today these findings and there is note of probable 50% or greater at the SMA and celiac origins. She tolerated her prep well. She takes no aspirin or blood thinning agents. She was treated with a course of Flagyl and Cipro at the time for acute illness. Of note she has a history of an episode of abdominal pain and rectal bleeding about 2 years ago biopsy showing some evidence of colonic ischemia.    Current Facility-Administered Medications:  .  0.9 %  sodium chloride infusion, , Intravenous, Continuous, Nicole Deem, MD, Last Rate: 20 mL/hr at 11/20/15 1322  Prescriptions Prior to Admission  Medication Sig Dispense Refill Last Dose  . budesonide-formoterol (SYMBICORT) 80-4.5 MCG/ACT inhaler Inhale 2 puffs into the lungs 2 (two) times daily.   Past Week at Unknown time  . citalopram (CELEXA) 20 MG tablet Take 20 mg by mouth daily.   11/19/2015 at Unknown time  . estradiol (ESTRACE) 2 MG tablet  Take 2 mg by mouth daily.   Past Week at Unknown time  . furosemide (LASIX) 20 MG tablet Take 20 mg by mouth.   Past Week at Unknown time  . lovastatin (MEVACOR) 20 MG tablet Take 20 mg by mouth at bedtime.   Past Week at Unknown time  . montelukast (SINGULAIR) 10 MG tablet Take 10 mg by mouth at bedtime.   Past Week at Unknown time  . triamcinolone (NASACORT ALLERGY 24HR) 55 MCG/ACT AERO nasal inhaler Place 2 sprays into the nose daily.   Past Week at Unknown time     Allergies  Allergen Reactions  . Salicylates   . Tramadol      Past Medical History:  Diagnosis Date  . Asthma   . Hyperlipidemia   . Osteoarthritis   . Surgical menopause     Review of systems:      Physical Exam    Heart and lungs: Regular rate and rhythm without rub or gallop, lungs are bilaterally clear.    HEENT: Normocephalic atraumatic eyes are anicteric    Other:     Pertinant exam for procedure: Soft mild tenderness to palpation across the upper abdomen. Particularly right upper abdomen. There are no masses or rebound.    Planned proceedures: Colonoscopy and indicated procedures. I have discussed the risks benefits and complications of procedures to include not limited to bleeding, infection, perforation and the risk of sedation and the patient wishes to proceed.    Nicole Deem, MD Gastroenterology 11/20/2015  3:34 PM

## 2015-11-20 NOTE — Transfer of Care (Signed)
Immediate Anesthesia Transfer of Care Note  Patient: Nicole Turner  Procedure(s) Performed: Procedure(s): COLONOSCOPY WITH PROPOFOL (N/A)  Patient Location: Endoscopy Unit  Anesthesia Type:General  Level of Consciousness: awake, alert , oriented and patient cooperative  Airway & Oxygen Therapy: Patient Spontanous Breathing and Patient connected to nasal cannula oxygen  Post-op Assessment: Report given to RN, Post -op Vital signs reviewed and stable and Patient moving all extremities X 4  Post vital signs: Reviewed and stable  Last Vitals:  Vitals:   11/20/15 1307  BP: 138/65  Pulse: 60  Resp: 16  Temp: 36.6 C    Last Pain:  Vitals:   11/20/15 1307  TempSrc: Tympanic         Complications: No apparent anesthesia complications

## 2015-11-20 NOTE — Telephone Encounter (Signed)
Received a call from Festus Barren that patient needs to be put on his schedule for 8:30 am tomorrow to be seen for colitis. Referred by [facility] GI. Patient did not answer the phone when I called to schedule.

## 2015-11-20 NOTE — Op Note (Signed)
[facility] Gastroenterology Patient Name: Nicole Turner Procedure Date: 11/20/2015 8:27 AM MRN: [id number] Account #: [id number] Date of Birth: [birth year] Admit Type: Outpatient Age: 60 Room: [facility] ENDO ROOM 1 Gender: Female Note Status: Finalized Procedure:            Colonoscopy Indications:          Generalized abdominal pain, Rectal bleeding Providers:            Christena Deem, MD Referring MD:         Danella Penton, MD (Referring MD) Medicines:            Monitored Anesthesia Care Complications:        No immediate complications. Procedure:            Pre-Anesthesia Assessment:                       - ASA Grade Assessment: II - A patient with mild                        systemic disease.                       After obtaining informed consent, the colonoscope was                        passed under direct vision. Throughout the procedure,                        the patient's blood pressure, pulse, and oxygen                        saturations were monitored continuously. The                        Colonoscope was introduced through the anus and                        advanced to the the cecum, identified by appendiceal                        orifice and ileocecal valve. The colonoscopy was                        performed without difficulty. The patient tolerated the                        procedure well. The colonoscopy was performed without                        difficulty. The patient tolerated the procedure well.                        The quality of the bowel preparation was good. Findings:      Multiple small-mouthed diverticula were found in the sigmoid colon and       distal descending colon.      External (thrombosed) and internal hemorrhoids were found during       retroflexion, during digital exam and during anoscopy. The hemorrhoids       were medium-sized and Grade II (internal hemorrhoids that prolapse but       reduce  spontaneously).      An area of mildly congested slightly scattered erythematous mucosa was       found in the sigmoid colon. Serial biopsies were taken from the cecum,       ascending, transverse descending, sigmoid and rectum.      The appendix showed periappendiceal inflammation with extrusion of       purulent material from the orifice.      No additional abnormalities were found on retroflexion.      The terminal ileum appeared normal. Impression:           - Diverticulosis in the sigmoid colon and in the distal                        descending colon.                       - External and internal hemorrhoids.                       - Congested mucosa in the sigmoid colon.                       - No specimens collected. Recommendation:       - Discharge patient to home.                       - Refer to vascular surgery at appointment to be                        scheduled.                       - Return to GI clinic in 10 days. Procedure Code(s):    --- Professional ---                       [phone], Colonoscopy, flexible; diagnostic, including                        collection of specimen(s) by brushing or washing, when                        performed (separate procedure) Diagnosis Code(s):    --- Professional ---                       K64.1, Second degree hemorrhoids                       K63.89, Other specified diseases of intestine                       R10.84, Generalized abdominal pain                       K62.5, Hemorrhage of anus and rectum                       K57.30, Diverticulosis of large intestine without                        perforation or abscess without bleeding CPT copyright 2016 American Medical Association. All rights reserved. The codes documented in this report are preliminary and upon  coder review may  be revised to meet current compliance requirements. Christena Deem, MD 11/20/2015 2:57:33 PM This report has been signed electronically. Number of Addenda: 0 Note  Initiated On: 11/20/2015 8:27 AM Scope Withdrawal Time: 0 hours 19 minutes 11 seconds  Total Procedure Duration: 0 hours 29 minutes 55 seconds       [facility]

## 2015-11-20 NOTE — Anesthesia Preprocedure Evaluation (Signed)
Anesthesia Evaluation  Patient identified by MRN, date of birth, ID band Patient awake    Reviewed: Allergy & Precautions, H&P , NPO status , Patient's Chart, lab work & pertinent test results, reviewed documented beta blocker date and time   Airway Mallampati: II   Neck ROM: full    Dental  (+) Poor Dentition   Pulmonary neg pulmonary ROS, asthma ,    Pulmonary exam normal        Cardiovascular negative cardio ROS Normal cardiovascular exam     Neuro/Psych negative neurological ROS  negative psych ROS   GI/Hepatic negative GI ROS, Neg liver ROS,   Endo/Other  negative endocrine ROS  Renal/GU negative Renal ROS  negative genitourinary   Musculoskeletal   Abdominal   Peds  Hematology negative hematology ROS (+)   Anesthesia Other Findings Past Medical History: No date: Asthma No date: Hyperlipidemia No date: Osteoarthritis 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 Past Surgical History: No date: COLONOSCOPY No date: ESOPHAGOGASTRODUODENOSCOPY No date: VAGINAL HYSTERECTOMY   Reproductive/Obstetrics                             Anesthesia Physical Anesthesia Plan  ASA: II  Anesthesia Plan: General   Post-op Pain Management:    Induction:   Airway Management Planned:   Additional Equipment:   Intra-op Plan:   Post-operative Plan:   Informed Consent: I have reviewed the patients History and Physical, chart, labs and discussed the procedure including the risks, benefits and alternatives for the proposed anesthesia with the patient or authorized representative who has indicated his/her understanding and acceptance.   Dental Advisory Given  Plan Discussed with: CRNA  Anesthesia Plan Comments:         Anesthesia Quick Evaluation

## 2015-11-21 ENCOUNTER — Encounter (INDEPENDENT_AMBULATORY_CARE_PROVIDER_SITE_OTHER): Payer: Self-pay

## 2015-11-21 ENCOUNTER — Encounter: Payer: Self-pay | Admitting: Gastroenterology

## 2015-11-21 ENCOUNTER — Other Ambulatory Visit: Payer: Self-pay | Admitting: Specialist

## 2015-11-21 ENCOUNTER — Ambulatory Visit (INDEPENDENT_AMBULATORY_CARE_PROVIDER_SITE_OTHER): Payer: BC Managed Care – PPO | Admitting: Vascular Surgery

## 2015-11-21 VITALS — BP 124/76 | HR 60 | Resp 16 | Ht 62.0 in | Wt 138.0 lb

## 2015-11-21 DIAGNOSIS — E785 Hyperlipidemia, unspecified: Secondary | ICD-10-CM

## 2015-11-21 DIAGNOSIS — K559 Vascular disorder of intestine, unspecified: Secondary | ICD-10-CM | POA: Insufficient documentation

## 2015-11-21 DIAGNOSIS — G8929 Other chronic pain: Secondary | ICD-10-CM | POA: Insufficient documentation

## 2015-11-21 DIAGNOSIS — R1013 Epigastric pain: Secondary | ICD-10-CM

## 2015-11-21 NOTE — Assessment & Plan Note (Signed)
She has significant colitis which is thought to be ischemic. See workup as below.

## 2015-11-21 NOTE — Progress Notes (Signed)
Patient ID: Nicole Turner, female   DOB: [birth year], 60 y.o.   MRN: [id number]  Chief Complaint  Patient presents with  . Re-evaluation    Blockage in stomach    HPI Nicole Turner is a 60 y.o. female.  I am asked to see the patient by Dr. Marva Panda for evaluation of ischemic colitis.  The patient reports a long history of abdominal pain and food intolerance. She has had unintentional weight loss and difficulty with eating now for years. There was no clear inciting event or causative factor that started her symptoms. She reports that anytime she eats now she has to run directly to the bathroom and has diarrhea. She has epigastric abdominal pain that is worse after eating. She reports continued worsening of her symptoms over several years. She had a previous mesenteric duplex was unrevealing several years ago. She had a recent colonoscopy showing inflammation throughout the colon and purulent material in the right colon and appendiceal orifice. The appearance was consistent with ischemic colitis and she was referred to [place] for further evaluation and treatment.  I have independently reviewed her CT scan when she does have significant aortic plaque although the very limited CT scan was not thin cuts and with 5 mm cuts is very difficult to really assess the mesenteric vasculature.   Past Medical History:  Diagnosis Date  . Asthma   . Hyperlipidemia   . Osteoarthritis   . Surgical menopause     Past Surgical History:  Procedure Laterality Date  . COLONOSCOPY    . COLONOSCOPY WITH PROPOFOL N/A 11/20/2015   Procedure: COLONOSCOPY WITH PROPOFOL;  Surgeon: Christena Deem, MD;  Location: [facility] ENDOSCOPY;  Service: Endoscopy;  Laterality: N/A;  . ESOPHAGOGASTRODUODENOSCOPY    . VAGINAL HYSTERECTOMY      No family history on file. No bleeding disorders, clotting disorders, autoimmune diseases, or aneurysms  Social History Social History  Substance Use Topics  . Smoking status: Former  Smoker    Types: Cigarettes  . Smokeless tobacco: Never Used  . Alcohol use No  Married, lives with husband   Allergies  Allergen Reactions  . Salicylates   . Tramadol     Current Outpatient Prescriptions  Medication Sig Dispense Refill  . budesonide-formoterol (SYMBICORT) 80-4.5 MCG/ACT inhaler Inhale 2 puffs into the lungs 2 (two) times daily.    . citalopram (CELEXA) 20 MG tablet Take 20 mg by mouth daily.    Marland Kitchen estradiol (ESTRACE) 2 MG tablet Take 2 mg by mouth daily.    . furosemide (LASIX) 20 MG tablet Take 20 mg by mouth.    . lovastatin (MEVACOR) 20 MG tablet Take 20 mg by mouth at bedtime.    . montelukast (SINGULAIR) 10 MG tablet Take 10 mg by mouth at bedtime.    . triamcinolone (NASACORT ALLERGY 24HR) 55 MCG/ACT AERO nasal inhaler Place 2 sprays into the nose daily.     No current facility-administered medications for this visit.       REVIEW OF SYSTEMS (Negative unless checked)  Constitutional: [x] Weight loss  [] Fever  [] Chills Cardiac: [] Chest pain   [] Chest pressure   [] Palpitations   [] Shortness of breath when laying flat   [] Shortness of breath at rest   [x] Shortness of breath with exertion. Vascular:  [] Pain in legs with walking   [] Pain in legs at rest   [] Pain in legs when laying flat   [] Claudication   [] Pain in feet when walking  [] Pain in feet at rest  []   Pain in feet when laying flat   [] History of DVT   [] Phlebitis   [] Swelling in legs   [] Varicose veins   [] Non-healing ulcers Pulmonary:   [] Uses home oxygen   [] Productive cough   [] Hemoptysis   [] Wheeze  [] COPD   [] Asthma Neurologic:  [] Dizziness  [] Blackouts   [] Seizures   [] History of stroke   [] History of TIA  [] Aphasia   [] Temporary blindness   [] Dysphagia   [] Weakness or numbness in arms   [] Weakness or numbness in legs Musculoskeletal:  [] Arthritis   [] Joint swelling   [] Joint pain   [] Low back pain Hematologic:  [] Easy bruising  [] Easy bleeding   [] Hypercoagulable state   [] Anemic   [] Hepatitis Gastrointestinal:  [x] Blood in stool   [] Vomiting blood  [] Gastroesophageal reflux/heartburn   [x] Abdominal pain Genitourinary:  [] Chronic kidney disease   [] Difficult urination  [] Frequent urination  [] Burning with urination   [] Hematuria Skin:  [] Rashes   [] Ulcers   [] Wounds Psychological:  [] History of anxiety   []  History of major depression.    Physical Exam BP 124/76 (BP Location: Right Arm)   Pulse 60   Resp 16   Ht 5\' 2"  (1.575 m)   Wt 138 lb (62.6 kg)   BMI 25.24 kg/m  Gen:  WD/WN, NAD Head: [place]/AT, No temporalis wasting. Prominent temp pulse not noted. Ear/Nose/Throat: Hearing grossly intact, nares w/o erythema or drainage, oropharynx w/o Erythema/Exudate Eyes: Conjunctiva clear, sclera non-icteric  Neck: trachea midline.  No JVD.  Pulmonary:  Good air movement, equalbilaterally.  Cardiac: RRR, normal S1, S2 Vascular:  Vessel Right Left  Radial Palpable Palpable                                   Gastrointestinal: Tenderness to palpation without rebound or guarding Musculoskeletal: M/S 5/5 throughout.  Extremities without ischemic changes.  No deformity or atrophy.  Neurologic: Sensation grossly intact in extremities.  Symmetrical.  Speech is fluent. Motor exam as listed above. Psychiatric: Judgment intact, Mood & affect appropriate for pt's clinical situation. Dermatologic: No rashes or ulcers noted.  No cellulitis or open wounds. Lymph : No Cervical, Axillary, or Inguinal lymphadenopathy.   Radiology Ct Abdomen Pelvis W Contrast  Result Date: 10/30/2015 CLINICAL DATA:  Generalized abdominal pain, nausea, diarrhea x2 days EXAM: CT ABDOMEN AND PELVIS WITH CONTRAST TECHNIQUE: Multidetector CT imaging of the abdomen and pelvis was performed using the standard protocol following bolus administration of intravenous contrast. CONTRAST:  ISOVUE-300 IOPAMIDOL (ISOVUE-300) INJECTION 61% COMPARISON:  01/05/2013 FINDINGS: Lower chest: Lung bases are  clear. Three vessel coronary atherosclerosis. Hepatobiliary: Liver is notable for focal fat/ altered perfusion along the falciform ligament (series 2/ image 27), similar to the prior. Gallbladder is unremarkable. No intrahepatic or extrahepatic ductal dilatation. Pancreas: Within normal limits. Spleen: Within normal limits. Adrenals/Urinary Tract: Adrenal glands are within normal limits. 4 mm left lower pole renal cyst (series 2/ image 44). Right kidney is within normal limits. No hydronephrosis. Bladder is within normal limits. Stomach/Bowel: Stomach is within normal limits. No evidence of bowel obstruction. Normal appendix (series 2/image 65). Long segment wall thickening involving the ascending and transverse colon. Descending colon and sigmoid are mildly thick-walled although underdistended. Left colonic diverticulosis, without evidence of diverticulitis. Vascular/Lymphatic: No evidence of abdominal aortic aneurysm. Atherosclerotic calcifications of the abdominal aorta and branch vessels. No suspicious abdominopelvic lymphadenopathy. Reproductive: Status post hysterectomy. No adnexal masses. Other: No abdominopelvic ascites. Tiny fat  containing periumbilical hernia. Musculoskeletal: Visualized osseous structures are within normal limits. IMPRESSION: Long segment colitis, predominantly involving the ascending/transverse colon, likely infectious/inflammatory. Age advanced three vessel coronary atherosclerosis. Aortic atherosclerosis. These results will be called to the ordering clinician or representative by the Radiologist Assistant, and communication documented in the PACS or zVision Dashboard. Electronically Signed   By: Charline Bills M.D.   On: 10/30/2015 14:14    Labs Recent Results (from the past 2160 hour(s))  CBC with Differential/Platelet     Status: None   Collection Time: 11/20/15  3:25 PM  Result Value Ref Range   WBC 5.5 3.6 - 11.0 K/uL   RBC 4.09 3.80 - 5.20 MIL/uL   Hemoglobin 13.3  12.0 - 16.0 g/dL   HCT 93.2 35.5 - 73.2 %   MCV 96.4 80.0 - 100.0 fL   MCH 32.4 26.0 - 34.0 pg   MCHC 33.7 32.0 - 36.0 g/dL   RDW 20.2 54.2 - 70.6 %   Platelets 173 150 - 440 K/uL   Neutrophils Relative % 50 %   Neutro Abs 2.8 1.4 - 6.5 K/uL   Lymphocytes Relative 32 %   Lymphs Abs 1.7 1.0 - 3.6 K/uL   Monocytes Relative 13 %   Monocytes Absolute 0.7 0.2 - 0.9 K/uL   Eosinophils Relative 4 %   Eosinophils Absolute 0.2 0 - 0.7 K/uL   Basophils Relative 1 %   Basophils Absolute 0.0 0 - 0.1 K/uL    Assessment/Plan:  Abdominal pain, chronic, epigastric She has significant colitis which is thought to be ischemic. See workup as below.  Hyperlipidemia lipid control important in reducing the progression of atherosclerotic disease. 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 I have independently reviewed her CT scan when she does have significant aortic plaque although the very limited CT scan was not thin cuts and with 5 mm cuts is very difficult to really assess the mesenteric vasculature. I had a long talk with her today about the pathophysiology and natural history of ischemic colitis. I discussed the most ischemic colitis is small vessel disease that is not amenable to therapy. There is an incidence of SMA or IMA proximal stenosis creating ischemic colitis that may be amenable to revascularization to improve the flow to the colon. I am not sure if she has this, but I do think it warrants further evaluation. She had a negative mesenteric duplex several years ago. At this point given her symptoms and Dr. Buck Mam findings on colonoscopy, I think proceeding with an angiogram for further evaluation is prudent. Intervention would be performed if appropriate. Risks and benefits are discussed.      Festus Barren 11/21/2015, 9:34 AM   This note was created with Dragon medical transcription system.  Any  errors from dictation are unintentional.

## 2015-11-21 NOTE — Assessment & Plan Note (Signed)
lipid control important in reducing the progression of atherosclerotic disease. Continue statin therapy  

## 2015-11-21 NOTE — Patient Instructions (Signed)
Colitis Introduction Colitis is inflammation of the colon. Colitis may last a short time (acute) or it may last a long time (chronic). What are the causes? This condition may be caused by:  Viruses.  Bacteria.  Reactions to medicine.  Certain autoimmune diseases, such as Crohn disease or ulcerative colitis. What are the signs or symptoms? Symptoms of this condition include:  Diarrhea.  Passing bloody or tarry stool.  Pain.  Fever.  Vomiting.  Tiredness (fatigue).  Weight loss.  Bloating.  Sudden increase in abdominal pain.  Having fewer bowel movements than usual. How is this diagnosed? This condition is diagnosed with a stool test or a blood test. You may also have other tests, including X-rays, a CT scan, or a colonoscopy. How is this treated? Treatment may include:  Resting the bowel. This involves not eating or drinking for a period of time.  Fluids that are given through an IV tube.  Medicine for pain and diarrhea.  Antibiotic medicines.  Cortisone medicines.  Surgery. Follow these instructions at home: Eating and drinking  Follow instructions from your health care provider about eating or drinking restrictions.  Drink enough fluid to keep your urine clear or pale yellow.  Work with a dietitian to determine which foods cause your condition to flare up.  Avoid foods that cause flare-ups.  Eat a well-balanced diet. Medicines  Take over-the-counter and prescription medicines only as told by your health care provider.  If you were prescribed an antibiotic medicine, take it as told by your health care provider. Do not stop taking the antibiotic even if you start to feel better. General instructions  Keep all follow-up visits as told by your health care provider. This is important. Contact a health care provider if:  Your symptoms do not go away.  You develop new symptoms. Get help right away if:  You have a fever that does not go away with  treatment.  You develop chills.  You have extreme weakness, fainting, or dehydration.  You have repeated vomiting.  You develop severe pain in your abdomen.  You pass bloody or tarry stool. This information is not intended to replace advice given to you by your health care provider. Make sure you discuss any questions you have with your health care provider. Document Released: 01/29/2004 Document Revised: 05/29/2015 Document Reviewed: 04/15/2014  2017 Elsevier  

## 2015-11-21 NOTE — Assessment & Plan Note (Addendum)
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 I have independently reviewed her CT scan when she does have significant aortic plaque although the very limited CT scan was not thin cuts and with 5 mm cuts is very difficult to really assess the mesenteric vasculature. I had a long talk with her today about the pathophysiology and natural history of ischemic colitis. I discussed the most ischemic colitis is small vessel disease that is not amenable to therapy. There is an incidence of SMA or IMA proximal stenosis creating ischemic colitis that may be amenable to revascularization to improve the flow to the colon. I am not sure if she has this, but I do think it warrants further evaluation. She had a negative mesenteric duplex several years ago. At this point given her symptoms and Dr. Reyes Ivan findings on colonoscopy, I think proceeding with an angiogram for further evaluation is prudent. Intervention would be performed if appropriate. Risks and benefits are discussed.

## 2015-11-24 ENCOUNTER — Encounter: Payer: Self-pay | Admitting: *Deleted

## 2015-11-24 ENCOUNTER — Encounter
Admission: RE | Disposition: A | Discharge status: Home / Self Care | Payer: Self-pay | Source: Ambulatory Visit | Attending: Vascular Surgery

## 2015-11-24 ENCOUNTER — Ambulatory Visit
Admission: RE | Admit: 2015-11-24 | Discharge: 2015-11-24 | Disposition: A | Discharge status: Home / Self Care | Payer: BC Managed Care – PPO | Source: Ambulatory Visit | Attending: Vascular Surgery | Admitting: Vascular Surgery

## 2015-11-24 ENCOUNTER — Other Ambulatory Visit (INDEPENDENT_AMBULATORY_CARE_PROVIDER_SITE_OTHER): Payer: Self-pay | Admitting: Vascular Surgery

## 2015-11-24 DIAGNOSIS — K551 Chronic vascular disorders of intestine: Secondary | ICD-10-CM | POA: Diagnosis not present

## 2015-11-24 DIAGNOSIS — Z885 Allergy status to narcotic agent status: Secondary | ICD-10-CM | POA: Insufficient documentation

## 2015-11-24 DIAGNOSIS — R634 Abnormal weight loss: Secondary | ICD-10-CM | POA: Diagnosis not present

## 2015-11-24 DIAGNOSIS — Z87891 Personal history of nicotine dependence: Secondary | ICD-10-CM | POA: Insufficient documentation

## 2015-11-24 DIAGNOSIS — Z888 Allergy status to other drugs, medicaments and biological substances status: Secondary | ICD-10-CM | POA: Insufficient documentation

## 2015-11-24 DIAGNOSIS — M1990 Unspecified osteoarthritis, unspecified site: Secondary | ICD-10-CM | POA: Diagnosis not present

## 2015-11-24 DIAGNOSIS — N951 Menopausal and female climacteric states: Secondary | ICD-10-CM | POA: Insufficient documentation

## 2015-11-24 DIAGNOSIS — Z90710 Acquired absence of both cervix and uterus: Secondary | ICD-10-CM | POA: Insufficient documentation

## 2015-11-24 DIAGNOSIS — K559 Vascular disorder of intestine, unspecified: Secondary | ICD-10-CM

## 2015-11-24 DIAGNOSIS — E785 Hyperlipidemia, unspecified: Secondary | ICD-10-CM | POA: Diagnosis not present

## 2015-11-24 HISTORY — PX: PERIPHERAL VASCULAR CATHETERIZATION: SHX172C

## 2015-11-24 HISTORY — DX: Vascular disorder of intestine, unspecified: K55.9

## 2015-11-24 LAB — CREATININE, SERUM
CREATININE: 0.71 mg/dL (ref 0.44–1.00)
GFR calc Af Amer: >60 mL/min (ref >60)
GFR calc non Af Amer: >60 mL/min (ref >60)

## 2015-11-24 LAB — BUN: BUN: 10 mg/dL (ref 6–20)

## 2015-11-24 LAB — SURGICAL PATHOLOGY

## 2015-11-24 SURGERY — VISCERAL ANGIOGRAPHY
Anesthesia: Moderate Sedation

## 2015-11-24 MED ORDER — MIDAZOLAM HCL 5 MG/5ML IJ SOLN
INTRAMUSCULAR | Status: AC
  Filled 2015-11-24: qty 5

## 2015-11-24 MED ORDER — HEPARIN SODIUM (PORCINE) 1000 UNIT/ML IJ SOLN
1000.0000 [IU] | Freq: Once | INTRAMUSCULAR | Status: AC
  Administered 2015-11-24: 1000 [IU] via INTRAVENOUS

## 2015-11-24 MED ORDER — HEPARIN SODIUM (PORCINE) 1000 UNIT/ML IJ SOLN
INTRAMUSCULAR | Status: AC
  Filled 2015-11-24: qty 1

## 2015-11-24 MED ORDER — ACETAMINOPHEN 325 MG PO TABS
ORAL_TABLET | ORAL | Status: AC
  Administered 2015-11-24: 650 mg via ORAL
  Filled 2015-11-24: qty 2

## 2015-11-24 MED ORDER — ACETAMINOPHEN 325 MG RE SUPP
325.0000 mg | RECTAL | Status: DC | PRN
  Filled 2015-11-24: qty 2

## 2015-11-24 MED ORDER — HYDRALAZINE HCL 20 MG/ML IJ SOLN: 5.0000 mg | INTRAMUSCULAR | Status: DC | PRN

## 2015-11-24 MED ORDER — GUAIFENESIN-DM 100-10 MG/5ML PO SYRP: 15.0000 mL | ORAL_SOLUTION | ORAL | Status: DC | PRN

## 2015-11-24 MED ORDER — LABETALOL HCL 5 MG/ML IV SOLN: 10.0000 mg | INTRAVENOUS | Status: DC | PRN

## 2015-11-24 MED ORDER — ONDANSETRON HCL 4 MG/2ML IJ SOLN: 4.0000 mg | Freq: Four times a day (QID) | INTRAMUSCULAR | Status: DC | PRN

## 2015-11-24 MED ORDER — CEFUROXIME SODIUM 1.5 G IJ SOLR
1.5000 g | INTRAMUSCULAR | Status: AC
  Administered 2015-11-24: 1.5 g via INTRAVENOUS

## 2015-11-24 MED ORDER — HEPARIN (PORCINE) IN NACL 2-0.9 UNIT/ML-% IJ SOLN
INTRAMUSCULAR | Status: AC
  Filled 2015-11-24: qty 1000

## 2015-11-24 MED ORDER — MIDAZOLAM HCL 5 MG/5ML IJ SOLN
INTRAMUSCULAR | Status: AC | PRN
  Administered 2015-11-24 (×2): 2 mg via INTRAVENOUS
  Administered 2015-11-24: 1 mg via INTRAVENOUS

## 2015-11-24 MED ORDER — SODIUM CHLORIDE 0.9 % IV SOLN
INTRAVENOUS | Status: DC
  Administered 2015-11-24: 11:00:00 via INTRAVENOUS

## 2015-11-24 MED ORDER — FENTANYL CITRATE (PF) 100 MCG/2ML IJ SOLN
INTRAMUSCULAR | Status: AC
  Filled 2015-11-24: qty 2

## 2015-11-24 MED ORDER — SODIUM CHLORIDE 0.9 % IV SOLN
500.0000 mL | Freq: Once | INTRAVENOUS | Status: DC | PRN
Start: 2015-11-24 — End: 2015-11-24

## 2015-11-24 MED ORDER — HEPARIN BOLUS VIA INFUSION
3000.0000 [IU] | Freq: Once | INTRAVENOUS | Status: AC
  Administered 2015-11-24: 3000 [IU] via INTRAVENOUS

## 2015-11-24 MED ORDER — HYDROMORPHONE HCL 1 MG/ML IJ SOLN: 1.0000 mg | Freq: Once | INTRAMUSCULAR | Status: DC

## 2015-11-24 MED ORDER — HYDROMORPHONE HCL 1 MG/ML IJ SOLN: 0.5000 mg | INTRAMUSCULAR | Status: DC | PRN

## 2015-11-24 MED ORDER — ACETAMINOPHEN 325 MG PO TABS
325.0000 mg | ORAL_TABLET | ORAL | Status: DC | PRN
  Administered 2015-11-24: 650 mg via ORAL

## 2015-11-24 MED ORDER — METHYLPREDNISOLONE SODIUM SUCC 125 MG IJ SOLR: 125.0000 mg | INTRAMUSCULAR | Status: DC | PRN

## 2015-11-24 MED ORDER — PHENOL 1.4 % MT LIQD
1.0000 | OROMUCOSAL | Status: DC | PRN
Start: 2015-11-24 — End: 2015-11-24
  Filled 2015-11-24: qty 177

## 2015-11-24 MED ORDER — FENTANYL CITRATE (PF) 100 MCG/2ML IJ SOLN
INTRAMUSCULAR | Status: AC | PRN
  Administered 2015-11-24: 25 ug via INTRAVENOUS
  Administered 2015-11-24 (×2): 50 ug via INTRAVENOUS
  Administered 2015-11-24: 25 ug via INTRAVENOUS

## 2015-11-24 MED ORDER — FAMOTIDINE 20 MG PO TABS: 40.0000 mg | ORAL_TABLET | ORAL | Status: DC | PRN

## 2015-11-24 MED ORDER — LIDOCAINE-EPINEPHRINE 1 %-1:100000 IJ SOLN
INTRAMUSCULAR | Status: AC
  Filled 2015-11-24: qty 1

## 2015-11-24 MED ORDER — OXYCODONE-ACETAMINOPHEN 5-325 MG PO TABS: 1.0000 | ORAL_TABLET | ORAL | Status: DC | PRN

## 2015-11-24 MED ORDER — CLOPIDOGREL BISULFATE 75 MG PO TABS: 75.0000 mg | ORAL_TABLET | Freq: Every day | ORAL | 11 refills | Status: AC

## 2015-11-24 MED ORDER — METOPROLOL TARTRATE 5 MG/5ML IV SOLN: 2.0000 mg | INTRAVENOUS | Status: DC | PRN

## 2015-11-24 SURGICAL SUPPLY — 17 items
BALLN ULTRVRSE 3X80X150 (BALLOONS) ×2
BALLN ULTRVRSE 3X80X150 OTW (BALLOONS) ×1
BALLOON ULTRVRSE 3X80X150 OTW (BALLOONS) ×1 IMPLANT
CATH 5FR REUT (CATHETERS) ×6 IMPLANT
CATH PIG 70CM (CATHETERS) ×3 IMPLANT
CATH RIM 65CM (CATHETERS) ×3 IMPLANT
DEVICE PRESTO INFLATION (MISCELLANEOUS) ×3 IMPLANT
DEVICE STARCLOSE SE CLOSURE (Vascular Products) ×3 IMPLANT
DEVICE TORQUE (MISCELLANEOUS) ×3 IMPLANT
GLIDEWIRE ANGLED SS 035X260CM (WIRE) ×3 IMPLANT
GUIDEWIRE PFTE-COATED .018X300 (WIRE) ×3 IMPLANT
PACK ANGIOGRAPHY (CUSTOM PROCEDURE TRAY) ×3 IMPLANT
SHEATH BRITE TIP 5FRX11 (SHEATH) ×3 IMPLANT
VALVE HEMO TOUHY BORST Y (VALVE) ×3 IMPLANT
WIRE G 018X200 V18 (WIRE) ×3 IMPLANT
WIRE G V18X300CM (WIRE) ×3 IMPLANT
WIRE J 3MM .035X145CM (WIRE) ×3 IMPLANT

## 2015-11-24 NOTE — OR Nursing (Signed)
Pain medication options given to pt, pt requested tylenol for back pain

## 2015-11-24 NOTE — Anesthesia Postprocedure Evaluation (Signed)
Anesthesia Post Note  Patient: ALLEE BUSK  Procedure(s) Performed: Procedure(s) (LRB): COLONOSCOPY WITH PROPOFOL (N/A)  Patient location during evaluation: PACU Anesthesia Type: General Level of consciousness: awake and alert Pain management: pain level controlled Vital Signs Assessment: post-procedure vital signs reviewed and stable Respiratory status: spontaneous breathing, nonlabored ventilation, respiratory function stable and patient connected to nasal cannula oxygen Cardiovascular status: blood pressure returned to baseline and stable Postop Assessment: no signs of nausea or vomiting Anesthetic complications: no    Last Vitals:  Vitals:   11/20/15 1507 11/20/15 1516  BP: (!) 119/53 (!) 128/51  Pulse: 71 71  Resp: 16 16  Temp:      Last Pain:  Vitals:   11/21/15 0735  TempSrc:   PainSc: 0-No pain                 Yevette Edwards

## 2015-11-24 NOTE — Discharge Instructions (Signed)

## 2015-11-24 NOTE — Op Note (Signed)
[facility] Percutaneous Study/Intervention Procedural Note   Date: 11/24/2015  Surgeon(s): Leotis Pain, MD  Assistants: none  Pre-operative Diagnosis: 1.  Chronic mesenteric ischemia with ischemic colitis   Post-operative diagnosis: Same  Procedure(s) Performed: 1. Ultrasound guidance for vascular access right femoral artery 2. Catheter placement into SMA and IMA from right femoral approach 3. Aortogram and selective angiogram of the SMA and IMA 4. PTA of the IMA with 3 mm diameter angioplasty balloon 5. StarClose closure device right femoral artery  Contrast: 70 cc  Fluoro time: 11.7 minutes  EBL: 25 cc  Anesthesia: Approximately 45 minutes of Moderate conscious sedation using 5 mg of Versed and 150 mcg of Fentanyl  Indications: Patient is a 60 y.o. female who has symptoms consistent with mesenteric ischemia. The patient has a CT scan showing calcification of the aorta and branch vessels, but it was very difficult to discern if any significant SMA or IMA stenosis was present. The patient is brought in for angiography for further evaluation and potential treatment. Risks and benefits are discussed and informed consent is obtained  Procedure: The patient was identified and appropriate procedural time out was performed. The patient was then placed supine on the table and prepped and draped in the usual sterile fashion. Moderate conscious sedation was administered during a face to face encounter with the patient throughout the procedure with my supervision of the RN administering medicines and monitoring the patient's vital signs, pulse oximetry, telemetry and mental status throughout from the start of the procedure until the patient was taken to the recovery room. Ultrasound was used to evaluate the right common femoral artery. It was patent . A digital ultrasound image was  acquired. A Seldinger needle was used to access the right common femoral artery under direct ultrasound guidance and a permanent image was performed. A 0.035 J wire was advanced without resistance and a 5Fr sheath was placed. Pigtail catheter was placed into the aorta and an AP aortogram was performed. This demonstrated what appeared to be reasonably normal flow in the renal arteries, aorta, and iliac vessels. We transitioned to the lateral projection to image the celiac and SMA. The lateral image demonstrated what appeared to be no significant celiac artery stenosis and a mild SMA stenosis proximally with brisk flow distally. The patient was given 4000 units of IV heparin.  A VS 1 catheter was used to selectively cannulate the superior mesenteric artery. This demonstrated a 20-25% stenosis of the proximal SMA without flow-limiting lesion. I then turned my attention to the inferior mesenteric artery. We transitioned to a steep RAO projection and used a VS 1 catheter to image the aorta to see the origin of the IMA. The IMA did appear to have a quite significant stenosis of greater than 70% although it was a small vessel with a steep angulation so this was difficult to discern. I then used a VS 1 catheter to selectively cannulate the IMA and perform selective imaging. The moderate to high-grade stenosis of the IMA appeared real. Based on her symptoms and these findings, I elected to treat the inferior mesenteric artery to try to improve the patient's clinical course. I crossed the lesion without difficulty with a Glidewire and parked the VS 1 catheter beyond the stenosis. I then exchanged for a 0.018 advantage wire and gained purchase with this wire up into the left colic artery. This was a small vessel, and a 3 mm diameter balloon appeared to be the appropriate size to treat the  origin of the IMA. I then used a 3 mm diameter x 8 cm length angioplasty balloon to perform percutaneous transluminal angioplasty of  the origin of the IMA. I inflated the balloon to 16 atm and held the inflation for 1 minute. On completion angiogram following this angioplasty, much lesser degree of stenosis that appeared to only be in the 20-25 % residual stenosis range was identified. At this point, I elected to terminate the procedure. The diagnostic catheter was removed. StarClose closure device was deployed in usual fashion with excellent hemostatic result. The patient was taken to the recovery room in stable condition having tolerated the procedure well.     Findings:Normal aorta and renal arteries. Celiac artery appeared to have no significant stenosis. Superior mesenteric artery had about a 20-25% stenosis. The inferior mesenteric artery had what appeared to be a greater than 70% stenosis.  Disposition: Patient was taken to the recovery room in stable condition having tolerated the procedure well.  Complications: None  Leotis Pain 11/24/2015 1:08 PM   This note was created with Dragon Medical transcription system. Any errors in dictation are purely unintentional.

## 2015-11-24 NOTE — H&P (Signed)
[facility] History & Physical Update  The patient was interviewed and re-examined.  The patient's previous History and Physical has been reviewed and is unchanged.  There is no change in the plan of care. We plan to proceed with the scheduled procedure.  Festus Barren, MD  11/24/2015, 10:21 AM

## 2015-11-25 ENCOUNTER — Encounter: Payer: Self-pay | Admitting: Vascular Surgery

## 2015-12-02 ENCOUNTER — Other Ambulatory Visit
Admission: RE | Admit: 2015-12-02 | Discharge: 2015-12-02 | Disposition: A | Discharge status: Home / Self Care | Payer: BC Managed Care – PPO | Source: Ambulatory Visit | Attending: Gastroenterology | Admitting: Gastroenterology

## 2015-12-02 DIAGNOSIS — K625 Hemorrhage of anus and rectum: Secondary | ICD-10-CM | POA: Insufficient documentation

## 2015-12-02 DIAGNOSIS — R1084 Generalized abdominal pain: Secondary | ICD-10-CM | POA: Diagnosis present

## 2015-12-05 ENCOUNTER — Other Ambulatory Visit
Admission: RE | Admit: 2015-12-05 | Discharge: 2015-12-05 | Disposition: A | Discharge status: Home / Self Care | Payer: BC Managed Care – PPO | Source: Ambulatory Visit | Attending: Gastroenterology | Admitting: Gastroenterology

## 2015-12-05 DIAGNOSIS — K51919 Ulcerative colitis, unspecified with unspecified complications: Secondary | ICD-10-CM | POA: Diagnosis not present

## 2015-12-05 DIAGNOSIS — R197 Diarrhea, unspecified: Secondary | ICD-10-CM | POA: Diagnosis present

## 2015-12-05 LAB — GASTROINTESTINAL PANEL BY PCR, STOOL (REPLACES STOOL CULTURE)
ADENOVIRUS F40/41: NOT DETECTED
ASTROVIRUS: NOT DETECTED
CAMPYLOBACTER SPECIES: NOT DETECTED
CYCLOSPORA CAYETANENSIS: NOT DETECTED
Cryptosporidium: NOT DETECTED
ENTAMOEBA HISTOLYTICA: NOT DETECTED
ENTEROPATHOGENIC E COLI (EPEC): NOT DETECTED
ENTEROTOXIGENIC E COLI (ETEC): NOT DETECTED
Enteroaggregative E coli (EAEC): NOT DETECTED
GIARDIA LAMBLIA: NOT DETECTED
Norovirus GI/GII: NOT DETECTED
Plesimonas shigelloides: NOT DETECTED
Rotavirus A: NOT DETECTED
Salmonella species: NOT DETECTED
Sapovirus (I, II, IV, and V): NOT DETECTED
Shiga like toxin producing E coli (STEC): NOT DETECTED
Shigella/Enteroinvasive E coli (EIEC): NOT DETECTED
VIBRIO CHOLERAE: NOT DETECTED
VIBRIO SPECIES: NOT DETECTED
YERSINIA ENTEROCOLITICA: NOT DETECTED

## 2015-12-06 ENCOUNTER — Other Ambulatory Visit
Admission: RE | Admit: 2015-12-06 | Discharge: 2015-12-06 | Disposition: A | Discharge status: Home / Self Care | Payer: BC Managed Care – PPO | Source: Ambulatory Visit | Attending: Gastroenterology | Admitting: Gastroenterology

## 2015-12-06 DIAGNOSIS — R197 Diarrhea, unspecified: Secondary | ICD-10-CM | POA: Insufficient documentation

## 2015-12-06 DIAGNOSIS — K51919 Ulcerative colitis, unspecified with unspecified complications: Secondary | ICD-10-CM | POA: Diagnosis present

## 2015-12-06 LAB — GASTROINTESTINAL PANEL BY PCR, STOOL (REPLACES STOOL CULTURE)

## 2015-12-06 LAB — C DIFFICILE QUICK SCREEN W PCR REFLEX
C DIFFICILE (CDIFF) TOXIN: NEGATIVE
C DIFFICLE (CDIFF) ANTIGEN: NEGATIVE
C Diff interpretation: NOT DETECTED

## 2015-12-16 LAB — MISCELLANEOUS TEST

## 2015-12-18 ENCOUNTER — Other Ambulatory Visit: Payer: Self-pay | Admitting: Gastroenterology

## 2015-12-18 DIAGNOSIS — K50919 Crohn's disease, unspecified, with unspecified complications: Secondary | ICD-10-CM

## 2015-12-18 DIAGNOSIS — Z79899 Other long term (current) drug therapy: Secondary | ICD-10-CM

## 2015-12-23 ENCOUNTER — Ambulatory Visit (INDEPENDENT_AMBULATORY_CARE_PROVIDER_SITE_OTHER): Payer: BC Managed Care – PPO | Admitting: Vascular Surgery

## 2015-12-26 ENCOUNTER — Ambulatory Visit
Admission: RE | Admit: 2015-12-26 | Discharge: 2015-12-26 | Disposition: A | Discharge status: Home / Self Care | Payer: BC Managed Care – PPO | Source: Ambulatory Visit | Attending: Gastroenterology | Admitting: Gastroenterology

## 2015-12-26 DIAGNOSIS — K219 Gastro-esophageal reflux disease without esophagitis: Secondary | ICD-10-CM | POA: Diagnosis not present

## 2015-12-26 DIAGNOSIS — Z79899 Other long term (current) drug therapy: Secondary | ICD-10-CM | POA: Insufficient documentation

## 2015-12-26 DIAGNOSIS — K50919 Crohn's disease, unspecified, with unspecified complications: Secondary | ICD-10-CM | POA: Diagnosis present

## 2015-12-26 DIAGNOSIS — K5710 Diverticulosis of small intestine without perforation or abscess without bleeding: Secondary | ICD-10-CM | POA: Insufficient documentation

## 2015-12-26 DIAGNOSIS — K449 Diaphragmatic hernia without obstruction or gangrene: Secondary | ICD-10-CM | POA: Diagnosis not present

## 2016-08-09 ENCOUNTER — Other Ambulatory Visit: Payer: Self-pay | Admitting: Internal Medicine

## 2016-08-09 DIAGNOSIS — G939 Disorder of brain, unspecified: Secondary | ICD-10-CM

## 2016-08-09 DIAGNOSIS — H532 Diplopia: Secondary | ICD-10-CM

## 2016-08-09 DIAGNOSIS — R51 Headache: Secondary | ICD-10-CM

## 2016-08-13 ENCOUNTER — Ambulatory Visit
Admission: RE | Admit: 2016-08-13 | Discharge: 2016-08-13 | Disposition: A | Discharge status: Home / Self Care | Payer: BC Managed Care – PPO | Source: Ambulatory Visit | Attending: Internal Medicine | Admitting: Internal Medicine

## 2016-08-13 ENCOUNTER — Other Ambulatory Visit
Admission: RE | Admit: 2016-08-13 | Discharge: 2016-08-13 | Disposition: A | Discharge status: Home / Self Care | Payer: BC Managed Care – PPO | Source: Ambulatory Visit | Attending: Internal Medicine | Admitting: Internal Medicine

## 2016-08-13 DIAGNOSIS — G939 Disorder of brain, unspecified: Secondary | ICD-10-CM | POA: Insufficient documentation

## 2016-08-13 DIAGNOSIS — R9082 White matter disease, unspecified: Secondary | ICD-10-CM | POA: Diagnosis not present

## 2016-08-13 DIAGNOSIS — H532 Diplopia: Secondary | ICD-10-CM

## 2016-08-13 DIAGNOSIS — R519 Headache, unspecified: Secondary | ICD-10-CM

## 2016-08-13 DIAGNOSIS — R51 Headache: Secondary | ICD-10-CM | POA: Diagnosis not present

## 2016-08-13 LAB — BASIC METABOLIC PANEL
Anion gap: 9 (ref 5–15)
BUN: 17 mg/dL (ref 6–20)
CALCIUM: 9 mg/dL (ref 8.9–10.3)
CHLORIDE: 107 mmol/L (ref 101–111)
CO2: 25 mmol/L (ref 22–32)
CREATININE: 0.93 mg/dL (ref 0.44–1.00)
GFR calc Af Amer: >60 mL/min (ref >60)
GFR calc non Af Amer: >60 mL/min (ref >60)
GLUCOSE: 85 mg/dL (ref 65–99)
Potassium: 4 mmol/L (ref 3.5–5.1)
Sodium: 141 mmol/L (ref 135–145)

## 2016-08-13 MED ORDER — GADOBENATE DIMEGLUMINE 529 MG/ML IV SOLN
15.0000 mL | Freq: Once | INTRAVENOUS | Status: AC | PRN
  Administered 2016-08-13: 12 mL via INTRAVENOUS

## 2016-08-31 ENCOUNTER — Other Ambulatory Visit: Payer: Self-pay | Admitting: Neurology

## 2016-08-31 DIAGNOSIS — R9389 Abnormal findings on diagnostic imaging of other specified body structures: Secondary | ICD-10-CM

## 2016-08-31 DIAGNOSIS — G35 Multiple sclerosis: Secondary | ICD-10-CM

## 2016-09-07 ENCOUNTER — Telehealth: Payer: Self-pay | Admitting: *Deleted

## 2016-09-07 NOTE — Sedation Documentation (Signed)
Pt called, she said she is not going to have LP. Encouraged to call Dr Sherryll Burger to let him know

## 2016-09-09 ENCOUNTER — Ambulatory Visit
Admission: RE | Admit: 2016-09-09 | Discharge: 2016-09-09 | Disposition: A | Discharge status: Home / Self Care | Payer: BC Managed Care – PPO | Source: Ambulatory Visit | Attending: Neurology | Admitting: Neurology

## 2017-02-03 ENCOUNTER — Other Ambulatory Visit: Payer: Self-pay | Admitting: Internal Medicine

## 2017-02-03 DIAGNOSIS — N63 Unspecified lump in unspecified breast: Secondary | ICD-10-CM

## 2017-02-03 DIAGNOSIS — Z1239 Encounter for other screening for malignant neoplasm of breast: Secondary | ICD-10-CM

## 2017-02-07 ENCOUNTER — Other Ambulatory Visit: Payer: Self-pay | Admitting: Internal Medicine

## 2017-02-07 ENCOUNTER — Ambulatory Visit
Admission: RE | Admit: 2017-02-07 | Discharge: 2017-02-07 | Disposition: A | Discharge status: Home / Self Care | Payer: BC Managed Care – PPO | Source: Ambulatory Visit | Attending: Internal Medicine | Admitting: Internal Medicine

## 2017-02-07 DIAGNOSIS — I639 Cerebral infarction, unspecified: Secondary | ICD-10-CM

## 2017-02-07 DIAGNOSIS — I6523 Occlusion and stenosis of bilateral carotid arteries: Secondary | ICD-10-CM | POA: Insufficient documentation

## 2017-02-07 LAB — POCT I-STAT CREATININE: CREATININE: 0.8 mg/dL (ref 0.44–1.00)

## 2017-02-07 MED ORDER — GADOBENATE DIMEGLUMINE 529 MG/ML IV SOLN
15.0000 mL | Freq: Once | INTRAVENOUS | Status: AC | PRN
  Administered 2017-02-07: 12 mL via INTRAVENOUS

## 2017-02-13 ENCOUNTER — Emergency Department: Payer: BC Managed Care – PPO

## 2017-02-13 ENCOUNTER — Other Ambulatory Visit: Payer: Self-pay

## 2017-02-13 ENCOUNTER — Emergency Department
Admission: EM | Admit: 2017-02-13 | Discharge: 2017-02-13 | Disposition: A | Discharge status: Home / Self Care | Payer: BC Managed Care – PPO | Attending: Emergency Medicine | Admitting: Emergency Medicine

## 2017-02-13 DIAGNOSIS — Z79899 Other long term (current) drug therapy: Secondary | ICD-10-CM | POA: Insufficient documentation

## 2017-02-13 DIAGNOSIS — G459 Transient cerebral ischemic attack, unspecified: Secondary | ICD-10-CM | POA: Diagnosis not present

## 2017-02-13 DIAGNOSIS — Z87891 Personal history of nicotine dependence: Secondary | ICD-10-CM | POA: Insufficient documentation

## 2017-02-13 DIAGNOSIS — J45909 Unspecified asthma, uncomplicated: Secondary | ICD-10-CM | POA: Insufficient documentation

## 2017-02-13 DIAGNOSIS — R200 Anesthesia of skin: Secondary | ICD-10-CM | POA: Diagnosis present

## 2017-02-13 LAB — COMPREHENSIVE METABOLIC PANEL
ALBUMIN: 3.3 g/dL — AB (ref 3.5–5.0)
ALK PHOS: 65 U/L (ref 38–126)
ALT: 13 U/L — ABNORMAL LOW (ref 14–54)
ANION GAP: 8 (ref 5–15)
AST: 19 U/L (ref 15–41)
BUN: 13 mg/dL (ref 6–20)
CALCIUM: 8.9 mg/dL (ref 8.9–10.3)
CHLORIDE: 107 mmol/L (ref 101–111)
CO2: 27 mmol/L (ref 22–32)
Creatinine, Ser: 0.87 mg/dL (ref 0.44–1.00)
GFR calc Af Amer: >60 mL/min (ref >60)
GFR calc non Af Amer: >60 mL/min (ref >60)
GLUCOSE: 109 mg/dL — AB (ref 65–99)
POTASSIUM: 3.6 mmol/L (ref 3.5–5.1)
SODIUM: 142 mmol/L (ref 135–145)
Total Bilirubin: 0.5 mg/dL (ref 0.3–1.2)
Total Protein: 6.2 g/dL — ABNORMAL LOW (ref 6.5–8.1)

## 2017-02-13 LAB — DIFFERENTIAL
BASOS PCT: 1 %
Basophils Absolute: 0.1 10*3/uL (ref 0–0.1)
Eosinophils Absolute: 0.2 10*3/uL (ref 0–0.7)
Eosinophils Relative: 2 %
LYMPHS PCT: 20 %
Lymphs Abs: 2.1 10*3/uL (ref 1.0–3.6)
Monocytes Absolute: 1 10*3/uL — ABNORMAL HIGH (ref 0.2–0.9)
Monocytes Relative: 10 %
NEUTROS PCT: 67 %
Neutro Abs: 7.2 10*3/uL — ABNORMAL HIGH (ref 1.4–6.5)

## 2017-02-13 LAB — CBC
HCT: 39 % (ref 35.0–47.0)
Hemoglobin: 13 g/dL (ref 12.0–16.0)
MCH: 33.4 pg (ref 26.0–34.0)
MCHC: 33.5 g/dL (ref 32.0–36.0)
MCV: 99.8 fL (ref 80.0–100.0)
PLATELETS: 164 10*3/uL (ref 150–440)
RBC: 3.91 MIL/uL (ref 3.80–5.20)
RDW: 12.5 % (ref 11.5–14.5)
WBC: 10.7 10*3/uL (ref 3.6–11.0)

## 2017-02-13 LAB — APTT: aPTT: 26 seconds (ref 24–36)

## 2017-02-13 LAB — TROPONIN I: Troponin I: <0.03 ng/mL (ref <0.03)

## 2017-02-13 LAB — GLUCOSE, CAPILLARY: Glucose-Capillary: 111 mg/dL — ABNORMAL HIGH (ref 65–99)

## 2017-02-13 LAB — PROTIME-INR
INR: 0.93
PROTHROMBIN TIME: 12.4 s (ref 11.4–15.2)

## 2017-02-13 MED ORDER — METRONIDAZOLE IN NACL 5-0.79 MG/ML-% IV SOLN: 500.0000 mg | Freq: Once | INTRAVENOUS | Status: DC

## 2017-02-13 MED ORDER — GI COCKTAIL ~~LOC~~: 30.0000 mL | Freq: Once | ORAL | Status: DC

## 2017-02-13 MED ORDER — NYSTATIN 100000 UNIT/ML MT SUSP: 5.0000 mL | Freq: Once | OROMUCOSAL | Status: DC

## 2017-02-13 MED ORDER — VANCOMYCIN 50 MG/ML ORAL SOLUTION
125.0000 mg | Freq: Once | ORAL | Status: DC
  Filled 2017-02-13: qty 2.5

## 2017-02-13 MED ORDER — ASPIRIN 81 MG PO CHEW
324.0000 mg | CHEWABLE_TABLET | Freq: Once | ORAL | Status: AC
  Administered 2017-02-13: 324 mg via ORAL
  Filled 2017-02-13: qty 4

## 2017-02-13 NOTE — ED Provider Notes (Signed)
[facility] Emergency Department Provider Note ____________________________________________   First MD Initiated Contact with Patient 02/13/17 1626     (approximate)  I have reviewed the triage vital signs and the nursing notes.   HISTORY  Chief Complaint Numbness    HPI Nicole Turner is a 62 y.o. female with past medical history as noted below and remote history of TIA who presents with right-sided numbness, acute onset today at approximately 10 AM, associated with weakness and difficulty gripping in her right hand, but not associated with headache, vision changes, weakness in other parts of her body, or speech problems.  The patient states that the weakness has mostly resolved, though she still continues to feel some tingling and decreased sensation in the right side of her face and right upper extremity.  She also reports that last week she had an episode where she lost vision in her left eye for approximately 2-3 minutes, and this visual disturbance resolved on its own.  Past Medical History:  Diagnosis Date  . Asthma   . Hyperlipidemia   . Osteoarthritis   . Surgical menopause     Patient Active Problem List   Diagnosis Date Noted  . Abdominal pain, chronic, epigastric 11/21/2015  . Hyperlipidemia 11/21/2015  . Ischemic colitis (HCC) 11/21/2015    Past Surgical History:  Procedure Laterality Date  . COLONOSCOPY    . COLONOSCOPY WITH PROPOFOL N/A 11/20/2015   Procedure: COLONOSCOPY WITH PROPOFOL;  Surgeon: Christena Deem, MD;  Location: [facility] ENDOSCOPY;  Service: Endoscopy;  Laterality: N/A;  . ESOPHAGOGASTRODUODENOSCOPY    . PERIPHERAL VASCULAR CATHETERIZATION N/A 11/24/2015   Procedure: Visceral Angiography;  Surgeon: Annice Needy, MD;  Location: [facility] INVASIVE CV LAB;  Service: Cardiovascular;  Laterality: N/A;  . VAGINAL HYSTERECTOMY      Prior to Admission medications   Medication Sig Start Date End Date Taking? Authorizing Provider    budesonide-formoterol (SYMBICORT) 80-4.5 MCG/ACT inhaler Inhale 2 puffs into the lungs 2 (two) times daily.   Yes [provider]  citalopram (CELEXA) 20 MG tablet Take 20 mg by mouth daily.   Yes [provider]  estradiol (ESTRACE) 2 MG tablet Take 2 mg by mouth daily.   Yes [provider]  lovastatin (MEVACOR) 20 MG tablet Take 20 mg by mouth at bedtime.   Yes [provider]  mesalamine (LIALDA) 1.2 g EC tablet Take 1.2 g by mouth 2 (two) times daily.   Yes [provider]  montelukast (SINGULAIR) 10 MG tablet Take 10 mg by mouth at bedtime.   Yes [provider]  clopidogrel (PLAVIX) 75 MG tablet Take 1 tablet (75 mg total) by mouth daily. Patient not taking: Reported on 02/13/2017 11/24/15   Annice Needy, MD    Allergies Salicylates and Tramadol  No family history on file.  Social History Social History   Tobacco Use  . Smoking status: Former Smoker    Types: Cigarettes  . Smokeless tobacco: Never Used  Substance Use Topics  . Alcohol use: No  . Drug use: No    Review of Systems  Constitutional: No fever. Eyes: No visual changes. ENT: No neck pain. Cardiovascular: Denies chest pain. Respiratory: Denies shortness of breath. Gastrointestinal: No nausea, no vomiting.  Genitourinary: Negative for dysuria.  Musculoskeletal: Negative for back pain. Skin: Negative for rash. Neurological: Positive for right arm and facial numbness/tingling.   ____________________________________________   PHYSICAL EXAM:  VITAL SIGNS: ED Triage Vitals [02/13/17 1604]  Enc Vitals  Group     BP (!) 154/60     Pulse Rate 64     Resp 18     Temp 98.6 F (37 C)     Temp Source Oral     SpO2 97 %     Weight 146 lb (66.2 kg)     Height 5\' 2"  (1.575 m)     Head Circumference      Peak Flow      Pain Score 0     Pain Loc      Pain Edu?      Excl. in GC?     Constitutional: Alert and oriented. Well appearing and in no acute  distress. Eyes: Conjunctivae are normal.  EOMI.  PERRLA. Head: Atraumatic. Nose: No congestion/rhinnorhea. Mouth/Throat: Mucous membranes are moist.   Neck: Normal range of motion.  Cardiovascular: Normal rate, regular rhythm. Grossly normal heart sounds.  Good peripheral circulation. Respiratory: Normal respiratory effort.  No retractions. Lungs CTAB. Gastrointestinal:. No distention.  Genitourinary: No flank tenderness. Musculoskeletal: No lower extremity edema.  Extremities warm and well perfused.  Neurologic:  Normal speech and language. No gross focal neurologic deficits are appreciated.  Decreased sensation to right face and right upper extremity.  Normal grip strength bilaterally.  No motor deficits. Skin:  Skin is warm and dry. No rash noted. Psychiatric: Mood and affect are normal. Speech and behavior are normal.  ____________________________________________   LABS (all labs ordered are listed, but only abnormal results are displayed)  Labs Reviewed  DIFFERENTIAL - Abnormal; Notable for the following components:      Result Value   Neutro Abs 7.2 (*)    Monocytes Absolute 1.0 (*)    All other components within normal limits  COMPREHENSIVE METABOLIC PANEL - Abnormal; Notable for the following components:   Glucose, Bld 109 (*)    Total Protein 6.2 (*)    Albumin 3.3 (*)    ALT 13 (*)    All other components within normal limits  GLUCOSE, CAPILLARY - Abnormal; Notable for the following components:   Glucose-Capillary 111 (*)    All other components within normal limits  PROTIME-INR  APTT  CBC  TROPONIN I  CBG MONITORING, ED   ____________________________________________  EKG  ED ECG REPORT I, , the attending physician, personally viewed and interpreted this ECG.  Date: 02/13/2017 EKG Time: 1628 Rate: 66 Rhythm: normal sinus rhythm QRS Axis: normal Intervals: normal ST/T Wave abnormalities: normal Narrative Interpretation: no evidence of  acute ischemia; no significant change when compared to EKG of 05/11/2009  ____________________________________________  RADIOLOGY  CT head: No ICH or other acute abnormalities  ____________________________________________   PROCEDURES  Procedure(s) performed: Yes  Procedures   NIH Stroke Scale  Interval: 6.5 hours Time: 4:40 PM Person Administering Scale: 07/11/2009  Administer stroke scale items in the order listed. Record performance in each category after each subscale exam. Do not go back and change scores. Follow directions provided for each exam technique. Scores should reflect what the patient does, not what the clinician thinks the patient can do. The clinician should record answers while administering the exam and work quickly. Except where indicated, the patient should not be coached (i.e., repeated requests to patient to make a special effort).   1a  Level of consciousness: 0  1b. LOC questions:  0  1c. LOC commands: 0  2.  Best Gaze: 0  3.  Visual: 0  4. Facial Palsy: 0  5a.  Motor left arm:  0  5b.  Motor right arm: 0  6a. motor left leg: 0  6b  Motor right leg:  0  7. Limb Ataxia: 0  8.  Sensory: 1  9. Best Language:  0  10. Dysarthria: 0  11. Extinction and Inattention: 0  12. Distal motor function: {0   Total:   1    Critical Care performed: No ____________________________________________   INITIAL IMPRESSION / ASSESSMENT AND PLAN / ED COURSE  Pertinent labs & imaging results that were available during my care of the patient were reviewed by me and considered in my medical decision making (see chart for details).  62 year old female with past medical history as noted above presents with acute onset of right upper extremity decreased sensation and tingling as well as some weakness particularly with right hand grip strength, and decreased sensation on the right side of her face.  Patient reports that the motor symptoms have resolved and the  sensory symptoms have improved but are still present.  She also reports episode lasting a few minutes of visual loss in her left eye approximately 1 week ago.  I reviewed the past medical records in epic; patient was evaluated by her primary care doctor Dr. Hyacinth Meeker within the last week, and had MRI of the brain and carotid Dopplers 6 days ago which were negative for concerning acute findings.  On exam here, vital signs are normal except for hypertension, the patient is comfortable appearing, and the neuro his exam is as described above with mild right facial and right upper extremity sensory abnormality but no other acute findings.  Patient's NIH stroke scale is 1 currently.  She has no LVO symptoms (no visual disturbance, aphasia or neglect).  Patient therefore is out of the window for code stroke.  Presentation is most concerning for TIA.  Plan for lab workup, CT head, and reassess.  Anticipate likely admission if symptoms persist given that ABCD 2 score is 4.    ----------------------------------------- 7:39 PM on 02/13/2017 -----------------------------------------  Patient states that the symptoms are the same (she still has tingling and decreased sensation but no recurrence or new motor symptoms).  Lab workup is unremarkable.  CT shows no acute findings.  Given that patient has no findings on CT, if she was admitted at this time it would be under observation.  I offered to proceed with such an admission, but the patient states that she does not want to be admitted under observation as her insurance may not pay for it.  Based on discussion with the patient, as well as with hospitalist, we will obtain an MRI from the ED.  If the MRI shows acute findings, then patient will be admitted.  If it is negative then I will reassess with the patient, determine whether her symptoms have changed, and then she can make an informed decision as to whether to stay for observation.  Patient agrees with this  plan.  ----------------------------------------- 11:26 PM on 02/13/2017 -----------------------------------------  MRI is negative for acute findings.  The patient on reassessment now states that her symptoms have almost completely resolved.  I again discussed with the patient her different options for disposition including admission for observation, versus discharge home.  The patient expresses a strong preference to go home.  She states that this time she is no longer concerned about this from a perspective of insurance or cost, but states that given that her symptoms have almost completely resolved and she is feeling much better she would prefer to  follow-up as an outpatient.  She states that she has an ongoing relationship with her neurologist, and easy access to her primary care doctor, and agrees to call them tomorrow morning to arrange follow-up within the next week.  I had an extensive discussion with the patient about her workup and the concern that with the symptoms she has had today and in the last several days and the diagnosis of likely TIA, her increased risk for stroke within the next several days which could lead to permanent disability and/or death.  Patient expresses understanding of this risk.  She agrees to return immediately if she has any new, worsening, or recurrent symptoms.    ____________________________________________   FINAL CLINICAL IMPRESSION(S) / ED DIAGNOSES  Final diagnoses:  TIA (transient ischemic attack)      NEW MEDICATIONS STARTED DURING THIS VISIT:  New Prescriptions   No medications on file     Note:  This document was prepared using Dragon voice recognition software and may include unintentional dictation errors.     Dionne Bucy, MD 02/13/17 2329

## 2017-02-13 NOTE — ED Notes (Signed)
Pt ambulated to in rm BR without difficulty. 

## 2017-02-13 NOTE — Discharge Instructions (Signed)
Call your neurologist and your primary care doctor tomorrow morning to arrange for follow-up within the next several days.  It is extremely important that he follow-up as soon as possible as an outpatient.  Continue to take your normal cholesterol and other medications as prescribed.    Return to the emergency department IMMEDIATELY for any new, worsening, or recurrent numbness, weakness, difficulty with grip, vision changes, vision loss, confusion, severe headache, difficulty with coordination or walking, speech disturbance or, or any other new or worsening symptoms that concern you.

## 2017-02-13 NOTE — ED Triage Notes (Signed)
Pt was sent from her PCP. Pt c/o around 10am having right arm and facial numbness with some mild weakness noted in grip of right hand. States last week she lost vision in her left eye for about 2-3 min before it returned..denies any difficulty with speech, no other noted neuro deficits at this time.

## 2017-02-13 NOTE — ED Notes (Signed)
Patient transported to CT 

## 2017-02-13 NOTE — ED Notes (Signed)
Patient transported to MRI by Rico Ala

## 2017-02-13 NOTE — ED Notes (Signed)
Pt reports right side feels "different" then left in face arms an legs, bilateral arm weakness (hand squeeze and push pull)

## 2017-02-15 ENCOUNTER — Ambulatory Visit
Admission: RE | Admit: 2017-02-15 | Discharge: 2017-02-15 | Disposition: A | Discharge status: Home / Self Care | Payer: BC Managed Care – PPO | Source: Ambulatory Visit | Attending: Internal Medicine | Admitting: Internal Medicine

## 2017-02-15 DIAGNOSIS — Z1239 Encounter for other screening for malignant neoplasm of breast: Secondary | ICD-10-CM

## 2017-02-15 DIAGNOSIS — N6002 Solitary cyst of left breast: Secondary | ICD-10-CM | POA: Insufficient documentation

## 2017-02-15 DIAGNOSIS — N63 Unspecified lump in unspecified breast: Secondary | ICD-10-CM

## 2017-02-15 DIAGNOSIS — N6001 Solitary cyst of right breast: Secondary | ICD-10-CM | POA: Diagnosis not present

## 2017-07-25 IMAGING — RF DG UGI W/ SMALL BOWEL
14 of 21 series · 14 of 22 positions shown · non-contrast
Comparison: Abdominal and pelvic CT scan October 30, 2015

CLINICAL DATA: Patient was diagnosed with inflammatory bowel
disease 1 month ago. The patient reports some difficulty swallowing
intermittently. History of gastroesophageal reflux.

EXAM:
UPPER GI SERIES WITH SMALL BOWEL FOLLOW-THROUGH using barium
FLUOROSCOPY TIME:  Fluoroscopy Time:  1 minutes, 42 seconds
Radiation Exposure Index (if provided by the fluoroscopic device):
4414 micro Gy per meter square
Number of Acquired Spot Images: 14 + 8 overhead images
TECHNIQUE: Combined double contrast and single contrast upper GI series using
effervescent crystals, thick barium, and thin barium. Subsequently,
serial images of the small bowel were obtained including spot views
of the terminal ileum.

[Series 1: t abdomen supine · 0.15mm/px · 1 of 1 slices shown]
[im 1/1]
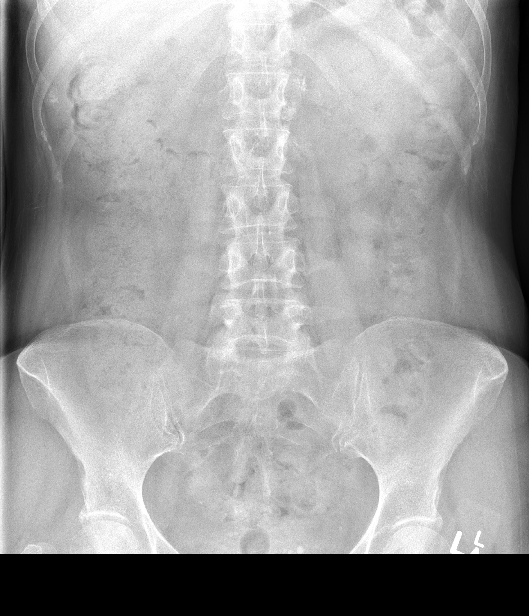

[Series 2: fluoro_barium swallow 2fps_bw · 0.17mm/px · 1 of 2 frames shown (1 of 9)]
[frame 2/2]
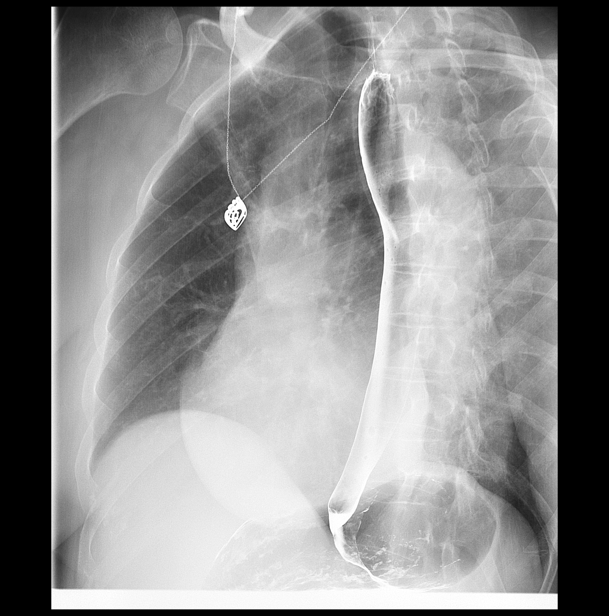

[Series 3: fluoro_barium swallow 2fps_bw · 0.17mm/px · 1 of 1 slices shown (2 of 9)]
[im 1/1]
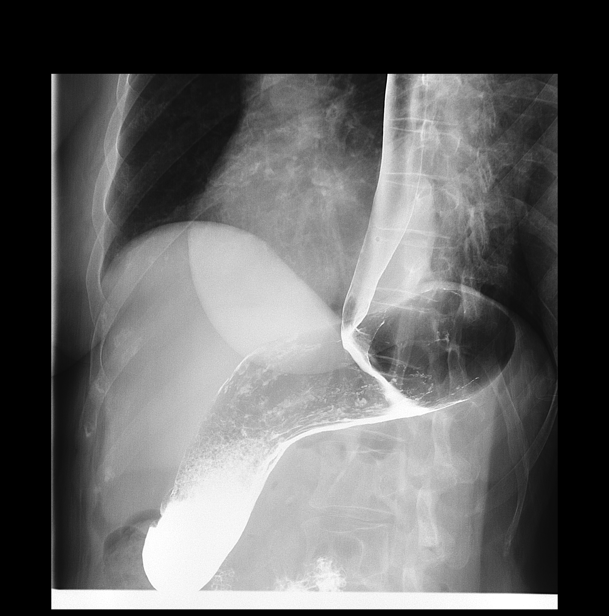

[Series 5: fluoro_barium swallow 2fps_bw · 0.18mm/px · 1 of 1 slices shown (3 of 9)]
[im 1/1]
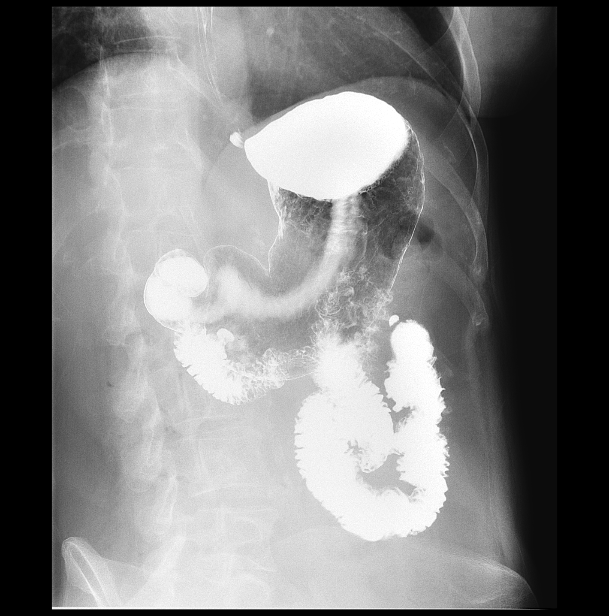

[Series 7: fluoro_barium swallow 2fps_bw · 0.19mm/px · 1 of 1 slices shown (4 of 9)]
[im 1/1]
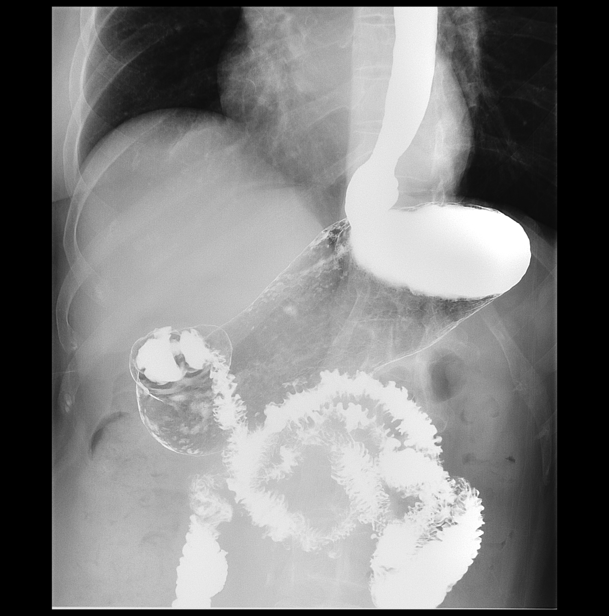

[Series 8: fluoro_barium swallow 2fps_bw · 0.19mm/px · 1 of 1 slices shown (5 of 9)]
[im 1/1]
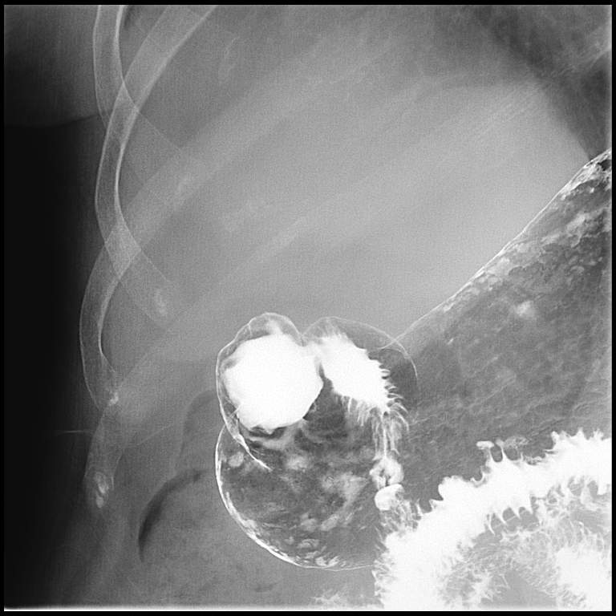

[Series 10: fluoro_barium swallow 2fps_bw · 0.19mm/px · 1 of 1 slices shown (6 of 9)]
[im 1/1]
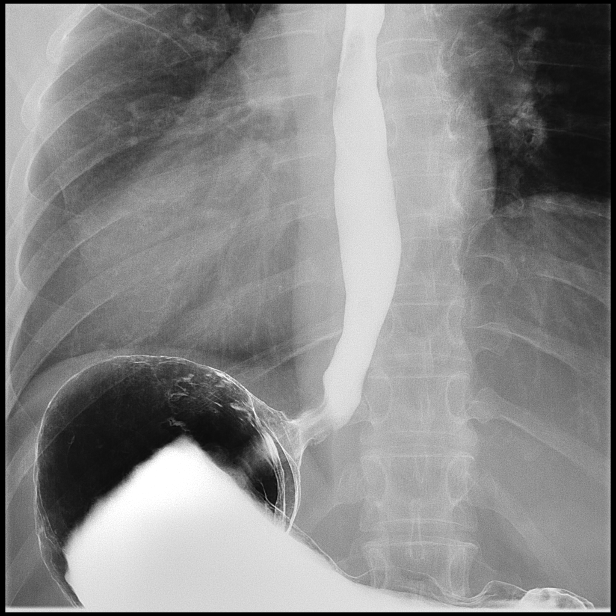

[Series 11: fluoro_barium swallow 2fps_bw · 0.19mm/px · 1 of 1 slices shown (7 of 9)]
[im 1/1]
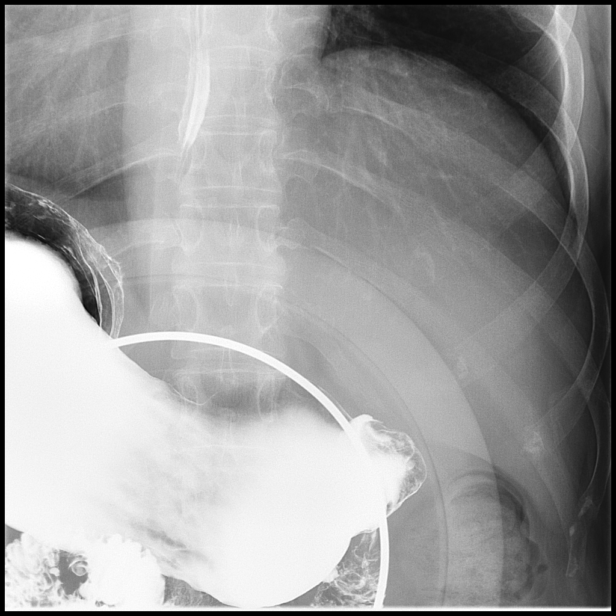

[Series 13: t abdomen barium pa · 0.15mm/px · 1 of 1 slices shown (1 of 4)]
[im 1/1]
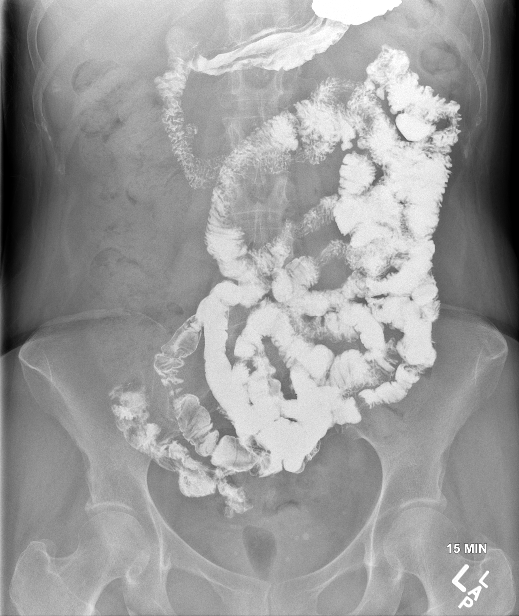

[Series 14: t abdomen barium pa · 0.15mm/px · 1 of 1 slices shown (2 of 4)]
[im 1/1]
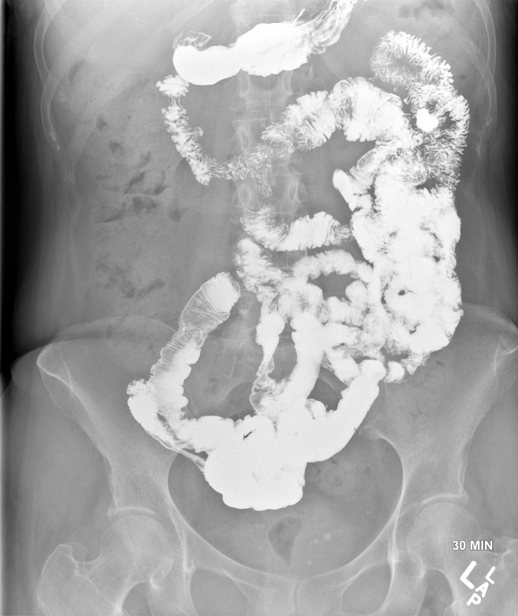

[Series 16: t abdomen barium pa · 0.15mm/px · 1 of 1 slices shown (3 of 4)]
[im 1/1]
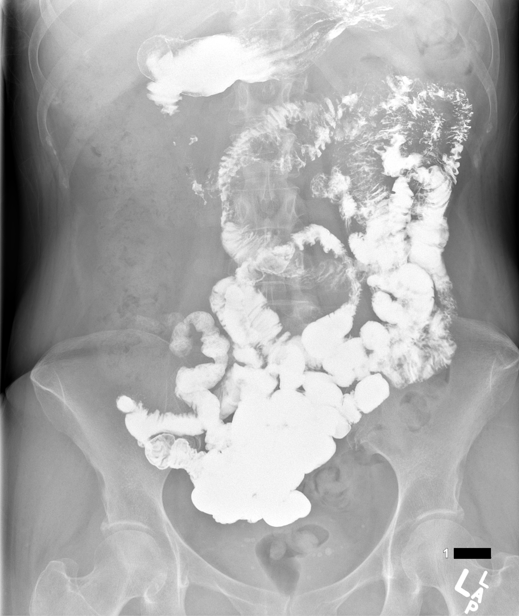

[Series 18: t abdomen barium pa · 0.15mm/px · 1 of 1 slices shown (4 of 4)]
[im 1/1]
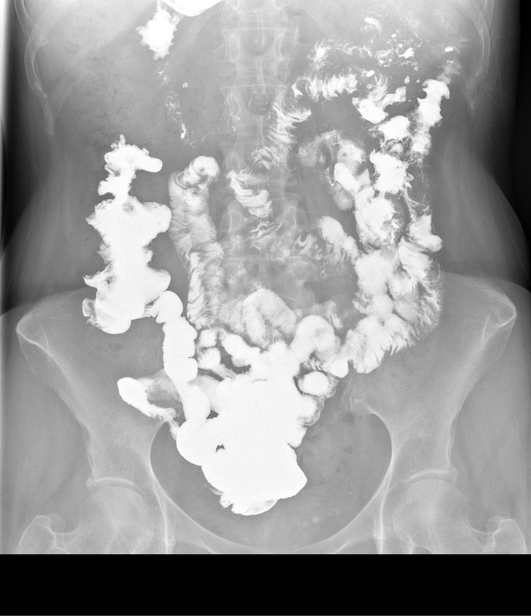

[Series 19: fluoro_barium swallow 2fps_bw · 0.18mm/px · 1 of 1 slices shown (8 of 9)]
[im 1/1]
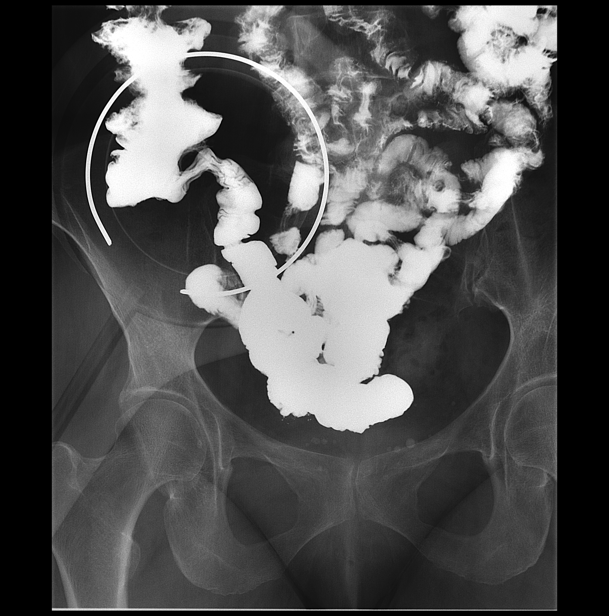

[Series 21: fluoro_barium swallow 2fps_bw · 0.18mm/px · 1 of 1 slices shown (9 of 9)]
[im 1/1]
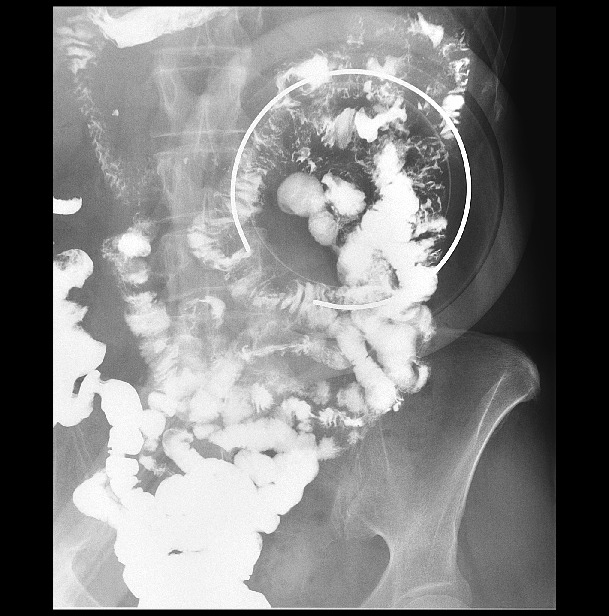

[14 of 22 positions shown; findings below may reference images not displayed]

FINDINGS: The scout image revealed a moderately increased colonic stool
burden. No small or large bowel obstructive pattern was observed.
There are phleboliths within the pelvis.

The patient ingested the thick and thin barium and gas forming
crystals without difficulty. The thoracic esophagus was normal in a
survey fashion. There was a large amount of spontaneous
gastroesophageal reflux with a small intermittent hiatal hernia
observed. The barium tablet passed promptly from the mouth to the
stomach.

The stomach distended well. The mucosal fold pattern was normal.
Gastric emptying was prompt. No ulcer niche was observed. The
duodenal bulb and C-loop were normal in position. There is a small
diverticulum associated with the second portion of the duodenum.
There were scattered tiny jejunal diverticulum.

Over the course of 1 hour 45 minutes the barium passed through the
small bowel and reached the right colon. The terminal ileum
demonstrated normal peristalsis and a normal mucosal pattern. The
jejunal and ileal loops exhibited no areas of fixed stricture nor of
abnormal dilation. The patient reported palpation over the mid
abdomen at approximately the level of the mid transverse colon.
IMPRESSION: 1. Large amount of gastroesophageal reflux. Small reducible hiatal
hernia.
2. Normal appearance of the stomach and duodenum. A small second
portion duodenal diverticulum is a normal variant.
3. Normal appearance of the jejunum and ileum. The terminal ileum
demonstrated a normal mucosal pattern.
4. Tenderness to palpation mid transverse colon.

## 2018-12-25 DIAGNOSIS — Z8616 Personal history of COVID-19: Secondary | ICD-10-CM

## 2018-12-25 HISTORY — DX: Personal history of COVID-19: Z86.16

## 2019-01-06 ENCOUNTER — Encounter: Payer: Self-pay | Admitting: Emergency Medicine

## 2019-01-06 ENCOUNTER — Emergency Department
Admission: EM | Admit: 2019-01-06 | Discharge: 2019-01-06 | Disposition: A | Discharge status: Home / Self Care | Payer: Medicare Other | Attending: Emergency Medicine | Admitting: Emergency Medicine

## 2019-01-06 ENCOUNTER — Emergency Department: Payer: Medicare Other

## 2019-01-06 ENCOUNTER — Other Ambulatory Visit: Payer: Self-pay

## 2019-01-06 DIAGNOSIS — R0602 Shortness of breath: Secondary | ICD-10-CM | POA: Diagnosis present

## 2019-01-06 DIAGNOSIS — J45909 Unspecified asthma, uncomplicated: Secondary | ICD-10-CM | POA: Insufficient documentation

## 2019-01-06 DIAGNOSIS — Z87891 Personal history of nicotine dependence: Secondary | ICD-10-CM | POA: Diagnosis not present

## 2019-01-06 DIAGNOSIS — J189 Pneumonia, unspecified organism: Secondary | ICD-10-CM | POA: Insufficient documentation

## 2019-01-06 DIAGNOSIS — U071 COVID-19: Secondary | ICD-10-CM | POA: Insufficient documentation

## 2019-01-06 DIAGNOSIS — J1282 Pneumonia due to coronavirus disease 2019: Secondary | ICD-10-CM

## 2019-01-06 LAB — CBC
HCT: 40.9 % (ref 36.0–46.0)
Hemoglobin: 14.3 g/dL (ref 12.0–15.0)
MCH: 32.1 pg (ref 26.0–34.0)
MCHC: 35 g/dL (ref 30.0–36.0)
MCV: 91.7 fL (ref 80.0–100.0)
Platelets: 206 10*3/uL (ref 150–400)
RBC: 4.46 MIL/uL (ref 3.87–5.11)
RDW: 11.2 % — ABNORMAL LOW (ref 11.5–15.5)
WBC: 14.1 10*3/uL — ABNORMAL HIGH (ref 4.0–10.5)
nRBC: 0 % (ref 0.0–0.2)

## 2019-01-06 LAB — BRAIN NATRIURETIC PEPTIDE: B Natriuretic Peptide: 91 pg/mL (ref 0.0–100.0)

## 2019-01-06 LAB — BASIC METABOLIC PANEL
Anion gap: 10 (ref 5–15)
BUN: 23 mg/dL (ref 8–23)
CO2: 29 mmol/L (ref 22–32)
Calcium: 8.8 mg/dL — ABNORMAL LOW (ref 8.9–10.3)
Chloride: 104 mmol/L (ref 98–111)
Creatinine, Ser: 0.78 mg/dL (ref 0.44–1.00)
GFR calc Af Amer: >60 mL/min (ref >60)
GFR calc non Af Amer: >60 mL/min (ref >60)
Glucose, Bld: 104 mg/dL — ABNORMAL HIGH (ref 70–99)
Potassium: 3.5 mmol/L (ref 3.5–5.1)
Sodium: 143 mmol/L (ref 135–145)

## 2019-01-06 LAB — TROPONIN I (HIGH SENSITIVITY): Troponin I (High Sensitivity): 5 ng/L (ref <18)

## 2019-01-06 MED ORDER — SODIUM CHLORIDE 0.9 % IV SOLN
2.0000 g | Freq: Once | INTRAVENOUS | Status: AC
  Administered 2019-01-06: 17:00:00 2 g via INTRAVENOUS
  Filled 2019-01-06: qty 20

## 2019-01-06 MED ORDER — METHYLPREDNISOLONE SODIUM SUCC 125 MG IJ SOLR
125.0000 mg | Freq: Once | INTRAMUSCULAR | Status: AC
  Administered 2019-01-06: 16:00:00 125 mg via INTRAVENOUS
  Filled 2019-01-06: qty 2

## 2019-01-06 MED ORDER — ACETAMINOPHEN 500 MG PO TABS
1000.0000 mg | ORAL_TABLET | Freq: Once | ORAL | Status: AC
Start: 2019-01-06 — End: 2019-01-06
  Administered 2019-01-06: 14:00:00 1000 mg via ORAL
  Filled 2019-01-06: qty 2

## 2019-01-06 MED ORDER — SODIUM CHLORIDE 0.9 % IV BOLUS
500.0000 mL | Freq: Once | INTRAVENOUS | Status: AC
  Administered 2019-01-06: 14:00:00 500 mL via INTRAVENOUS

## 2019-01-06 MED ORDER — DOXYCYCLINE HYCLATE 100 MG PO TABS
100.0000 mg | ORAL_TABLET | Freq: Once | ORAL | Status: AC
  Administered 2019-01-06: 17:00:00 100 mg via ORAL
  Filled 2019-01-06: qty 1

## 2019-01-06 MED ORDER — DOXYCYCLINE HYCLATE 100 MG PO CAPS: 100.0000 mg | ORAL_CAPSULE | Freq: Two times a day (BID) | ORAL | 0 refills | Status: AC

## 2019-01-06 MED ORDER — PREDNISONE 10 MG PO TABS: ORAL_TABLET | ORAL | 0 refills | Status: AC

## 2019-01-06 NOTE — ED Notes (Signed)
Peripheral IV discontinued. Catheter intact. No signs of infiltration or redness. Gauze applied to IV site.    Discharge instructions reviewed with patient. Questions fielded by this RN. Patient verbalizes understanding of instructions. Patient discharged home in stable condition per [facility]. No acute distress noted at time of discharge.   Pt getting dressed will be wheeled to ED front to family vehicle by Amaya, EDT

## 2019-01-06 NOTE — ED Triage Notes (Signed)
Pt via EMS from home. Pt is a known positive for COVID tested positive on the 20th of December. Pt states she feels more SOB today that she has been. Pt A&Ox4 and NAD at this time

## 2019-01-06 NOTE — ED Provider Notes (Signed)
[facility] Emergency Department Provider Note       Time seen: ----------------------------------------- 1:45 PM on 01/06/2019 -----------------------------------------   I have reviewed the triage vital signs and the nursing notes.  HISTORY   Chief Complaint Shortness of Breath    HPI Nicole Turner is a 64 y.o. female with a history of asthma, hyperlipidemia, osteoarthritis who presents to the ED for shortness of breath.  Patient tested positive for Covid on 20 December.  Patient states she is more short of breath today than she has been.  She arrives alert and oriented, denying any pain.  Past Medical History:  Diagnosis Date  . Asthma   . Hyperlipidemia   . Osteoarthritis   . Surgical menopause     Patient Active Problem List   Diagnosis Date Noted  . Abdominal pain, chronic, epigastric 11/21/2015  . Hyperlipidemia 11/21/2015  . Ischemic colitis ([place]) 11/21/2015    Past Surgical History:  Procedure Laterality Date  . COLONOSCOPY    . COLONOSCOPY WITH PROPOFOL N/A 11/20/2015   Procedure: COLONOSCOPY WITH PROPOFOL;  Surgeon: Lollie Sails, MD;  Location[facility] ENDOSCOPY;  Service: Endoscopy;  Laterality: N/A;  . ESOPHAGOGASTRODUODENOSCOPY    . PERIPHERAL VASCULAR CATHETERIZATION N/A 11/24/2015   Procedure: Visceral Angiography;  Surgeon: Algernon Huxley, MD;  Location: [place] CV LAB;  Service: Cardiovascular;  Laterality: N/A;  . VAGINAL HYSTERECTOMY      Allergies Salicylates and Tramadol  Social History Social History   Tobacco Use  . Smoking status: Former Smoker    Types: Cigarettes  . Smokeless tobacco: Never Used  Substance Use Topics  . Alcohol use: No  . Drug use: No   Review of Systems Constitutional: Negative for fever. Cardiovascular: Negative for chest pain. Respiratory: Positive for shortness of breath Gastrointestinal: Negative for abdominal pain, vomiting and diarrhea. Musculoskeletal: Negative for back  pain. Skin: Negative for rash. Neurological: Negative for headaches, focal weakness or numbness.  All systems negative/normal/unremarkable except as stated in the HPI  ____________________________________________   PHYSICAL EXAM:  VITAL SIGNS: ED Triage Vitals  Enc Vitals Group     BP 01/06/19 1339 125/62     Pulse Rate 01/06/19 1339 69     Resp 01/06/19 1339 20     Temp 01/06/19 1339 98.1 F (36.7 C)     Temp Source 01/06/19 1339 Oral     SpO2 01/06/19 1339 94 %     Weight 01/06/19 1335 135 lb (61.2 kg)     Height 01/06/19 1335 5\' 2"  (1.575 m)     Head Circumference --      Peak Flow --      Pain Score 01/06/19 1335 0     Pain Loc --      Pain Edu? --      Excl. in [place]? --    Constitutional: Alert and oriented. Well appearing and in no distress. Eyes: Conjunctivae are normal. Normal extraocular movements. Cardiovascular: Normal rate, regular rhythm. No murmurs, rubs, or gallops. Respiratory: Normal respiratory effort without tachypnea nor retractions. Breath sounds are clear and equal bilaterally. No wheezes/rales/rhonchi. Gastrointestinal: Soft and nontender. Normal bowel sounds Musculoskeletal: Nontender with normal range of motion in extremities. No lower extremity tenderness nor edema. Neurologic:  Normal speech and language. No gross focal neurologic deficits are appreciated.  Skin:  Skin is warm, dry and intact. No rash noted. Psychiatric: Mood and affect are normal. Speech and behavior are normal.  ____________________________________________  EKG: Interpreted by me.  Sinus  rhythm with rate of 60 bpm, normal PR interval, low voltage, borderline left axis deviation  ____________________________________________  ED COURSE:  As part of my medical decision making, I reviewed the following data within the electronic MEDICAL RECORD NUMBER History obtained from family if available, nursing notes, old chart and ekg, as well as notes from prior ED visits. Patient presented  for dyspnea, we will assess with labs and imaging as indicated at this time.   Procedures  Nicole Turner was evaluated in Emergency Department on 01/06/2019 for the symptoms described in the history of present illness. She was evaluated in the context of the global COVID-19 pandemic, which necessitated consideration that the patient might be at risk for infection with the SARS-CoV-2 virus that causes COVID-19. Institutional protocols and algorithms that pertain to the evaluation of patients at risk for COVID-19 are in a state of rapid change based on information released by regulatory bodies including the CDC and federal and state organizations. These policies and algorithms were followed during the patient's care in the ED.  ____________________________________________   LABS (pertinent positives/negatives)  Labs Reviewed  CBC - Abnormal; Notable for the following components:      Result Value   WBC 14.1 (*)    RDW 11.2 (*)    All other components within normal limits  BASIC METABOLIC PANEL - Abnormal; Notable for the following components:   Glucose, Bld 104 (*)    Calcium 8.8 (*)    All other components within normal limits  BRAIN NATRIURETIC PEPTIDE  TROPONIN I (HIGH SENSITIVITY)    RADIOLOGY Images were viewed by me  Chest x-ray IMPRESSION:  Patchy airspace opacities consistent with the given clinical history  of COVID 19 positivity.  ____________________________________________   DIFFERENTIAL DIAGNOSIS   COVID-19, pneumonia, dehydration, electrolyte abnormality, arrhythmia  FINAL ASSESSMENT AND PLAN  COVID-19   Plan: The patient had presented for persistent symptoms of COVID-19. Patient's labs are still pending at this time. Patient's imaging look as expected.  Overall her vital signs are normal and she is not hypoxic.  She was given Solu-Medrol here.   Ulice Dash, MD    Note: This note was generated in part or whole with voice recognition software. Voice  recognition is usually quite accurate but there are transcription errors that can and very often do occur. I apologize for any typographical errors that were not detected and corrected.     Emily Filbert, MD 01/06/19 [phone]

## 2019-01-06 NOTE — Discharge Instructions (Addendum)
As we discussed, I think it's best to go back to 40 mg on your prednisone for a short duration, then taper down more slowly.  I have also prescribed an additional antibiotic for possible superimposed bacterial infection.  Continue to drink plenty of fluids.  Use your advair as prescribed.

## 2019-01-06 NOTE — ED Notes (Signed)
Pt ambulated without assistance around the room. Pt O2 sat maintained 97-99% on RA.

## 2019-01-06 NOTE — ED Provider Notes (Addendum)
Patient care assumed at 3 PM.  Briefly, patient is a 64 year old female here with Covid positive shortness of breath.  Lab work is very reassuring.  She is satting well on room air.  She has been on outpatient steroids, but these were recently decreased which does correlate with worsening of her symptoms.  Current plan is to increase back to a higher dose and have her follow-up with her PCP closely.  She does have possible pneumonia on chest x-ray given her immunosuppressed status, will cover empirically with antibiotics again, though she otherwise appears well and does not meet inpatient criteria. She is ambulatory in ED with >92% sats, speaking in full sentences.   Shaune Pollack, MD 01/06/19 1718    Shaune Pollack, MD 01/06/19 [phone]

## 2019-01-06 NOTE — ED Notes (Signed)
Pt ambulated to the bathroom without assistance. 

## 2019-02-09 ENCOUNTER — Encounter: Payer: Self-pay | Admitting: Emergency Medicine

## 2019-02-09 ENCOUNTER — Ambulatory Visit
Admission: EM | Admit: 2019-02-09 | Discharge: 2019-02-09 | Disposition: A | Discharge status: Home / Self Care | Payer: Medicare Other | Attending: Family Medicine | Admitting: Family Medicine

## 2019-02-09 ENCOUNTER — Other Ambulatory Visit: Payer: Self-pay

## 2019-02-09 DIAGNOSIS — Z20822 Contact with and (suspected) exposure to covid-19: Secondary | ICD-10-CM

## 2019-02-09 DIAGNOSIS — Z7189 Other specified counseling: Secondary | ICD-10-CM | POA: Diagnosis not present

## 2019-02-09 DIAGNOSIS — Z8616 Personal history of COVID-19: Secondary | ICD-10-CM | POA: Diagnosis present

## 2019-02-09 DIAGNOSIS — G933 Postviral fatigue syndrome: Secondary | ICD-10-CM | POA: Diagnosis not present

## 2019-02-09 DIAGNOSIS — G9331 Postviral fatigue syndrome: Secondary | ICD-10-CM

## 2019-02-09 NOTE — ED Triage Notes (Signed)
Patient states that she is here for a COVID Test.  Patient states the she had COVID in December.  Her father recently tested positive for COVID for the second time.  Patient denies any symptoms.

## 2019-02-09 NOTE — Discharge Instructions (Addendum)
It was very nice seeing you today in clinic. Thank you for entrusting me with your care.   Rest and stay HYDRATED. Water and electrolyte containing beverages (Gatorade, Pedialyte) are best to prevent dehydration and electrolyte abnormalities.   May use Tylenol and/or Ibuprofen as needed for pain/fever.   SARS-CoV-2 (novel coronavirus) (+) in the last 90 days. Reinfection within 90 day window is unlikely, however it is possible.   You were tested for SARS-CoV-2 (novel coronavirus) today. Testing is performed by an outside lab ([facility]) and has variable turn around times ranging between 2-5 days. Current recommendations from the the CDC and [place] DHHS require that you remain out of work in order to quarantine at home until negative test results are have been received. In the event that your test results are positive, you will be contacted with further directives. These measures are being implemented out of an abundance of caution to prevent transmission and spread during the current SARS-CoV-2 pandemic.  Make arrangements to follow up with your regular doctor in 1 week for re-evaluation if not improving. If your symptoms/condition worsens, please seek follow up care either here or in the ER. Please remember, our [facility] providers are "right here with you" when you need [place].   Again, it was my pleasure to take care of you today. Thank you for choosing our clinic. I hope that you start to feel better quickly.   Quentin Mulling, MSN, APRN, FNP-C, CEN Advanced Practice Provider [facility]

## 2019-02-09 NOTE — ED Provider Notes (Addendum)
Nicole Turner, Nicole Turner   Name: Nicole Turner DOB: Mar 14, 1955 MRN: [id number] CSN: [id number] PCP: Danella Penton, MD  Arrival date and time:  02/09/19 1203  Chief Complaint:  COVID Test   NOTE: Prior to seeing the patient today, I have reviewed the triage nursing documentation and vital signs. Clinical staff has updated patient's PMH/PSHx, current medication list, and drug allergies/intolerances to ensure comprehensive history available to assist in medical decision making.   History:   HPI: Nicole Turner is a 64 y.o. female who presents today with complaints of recent direct exposure to SARS-CoV-2 (novel coronavirus). Past history is complicated. Patient had SARS-CoV-2 back on 12/25/2019. She recovered without incident. Patient reports that her father has been ill for quite sometime. He was hospitalized at [facility] back in December for 5 weeks and subsequently discharged to [facility], where he has been for the last 2-3 weeks. Patient reports that her father was admitted to [facility] yesterday after he "tested positive again" for SARS-CoV-2. Patient presents today out of concerns for her own personal health. She denies any new symptoms. She has post-viral symptoms (fatigue, headache, anosmia, and ageusia) following her SARS-CoV-2 infection back in December.   Past Medical History:  Diagnosis Date  . Asthma   . History of 2019 novel coronavirus disease (COVID-19) 12/25/2018  . Hyperlipidemia   . Osteoarthritis   . Surgical menopause     Past Surgical History:  Procedure Laterality Date  . COLONOSCOPY    . COLONOSCOPY WITH PROPOFOL N/A 11/20/2015   Procedure: COLONOSCOPY WITH PROPOFOL;  Surgeon: Christena Deem, MD;  Location: [facility] ENDOSCOPY;  Service: Endoscopy;  Laterality: N/A;  . ESOPHAGOGASTRODUODENOSCOPY    . PERIPHERAL VASCULAR CATHETERIZATION N/A 11/24/2015   Procedure: Visceral Angiography;  Surgeon: Annice Needy, MD;  Location: [facility] INVASIVE CV LAB;  Service:  Cardiovascular;  Laterality: N/A;  . VAGINAL HYSTERECTOMY      Family History  Problem Relation Age of Onset  . Dementia Mother   . Heart disease Father   . Dementia Father   . Breast cancer Neg Hx     Social History   Tobacco Use  . Smoking status: Former Smoker    Types: Cigarettes  . Smokeless tobacco: Never Used  Substance Use Topics  . Alcohol use: No  . Drug use: No    Patient Active Problem List   Diagnosis Date Noted  . Abdominal pain, chronic, epigastric 11/21/2015  . Hyperlipidemia 11/21/2015  . Ischemic colitis ([facility]) 11/21/2015    Home Medications:    Current Meds  Medication Sig  . budesonide-formoterol (SYMBICORT) 80-4.5 MCG/ACT inhaler Inhale 2 puffs into the lungs 2 (two) times daily.  . citalopram (CELEXA) 20 MG tablet Take 20 mg by mouth daily.  . clopidogrel (PLAVIX) 75 MG tablet Take 1 tablet (75 mg total) by mouth daily.  Marland Kitchen estradiol (ESTRACE) 2 MG tablet Take 2 mg by mouth daily.  Marland Kitchen lovastatin (MEVACOR) 20 MG tablet Take 20 mg by mouth at bedtime.  . mesalamine (LIALDA) 1.2 g EC tablet Take 1.2 g by mouth 2 (two) times daily.  . montelukast (SINGULAIR) 10 MG tablet Take 10 mg by mouth at bedtime.    Allergies:   Salicylates and Tramadol  Review of Systems (ROS): Review of Systems  Constitutional: Fatigue. Negative for fever.  HENT: Seasonal allergies. (+) altered sense of taste and smell. Negative for congestion, ear pain, postnasal drip, rhinorrhea, sinus pressure, sinus pain, sneezing and sore throat.   Eyes: Negative  for pain, discharge and redness.  Respiratory: Negative for cough, chest tightness and shortness of breath.   Cardiovascular: Negative for chest pain and palpitations.  Gastrointestinal: Negative for abdominal pain, diarrhea, nausea and vomiting.  Musculoskeletal: Negative for arthralgias, back pain, myalgias and neck pain.  Skin: Negative for color change, pallor and rash.  Neurological: Generalized headaches. Negative for  dizziness, syncope, weakness.  Hematological: Negative for adenopathy.   Vital Signs: Today's Vitals   02/09/19 1213 02/09/19 1216 02/09/19 1238  BP:  (!) 148/69   Pulse:  80   Resp:  14   Temp:  99.1 F (37.3 C)   TempSrc:  Oral   SpO2:  97%   Weight: 140 lb (63.5 kg)    Height: 5\' 2"  (1.575 m)    PainSc: 0-No pain  0-No pain    Physical Exam: Physical Exam  Constitutional: Oriented to person, place, and time and well-developed, well-nourished, and in no distress.  HENT:  Head: Normocephalic and atraumatic.  Eyes: Pupils are equal, round, and reactive to light.  Cardiovascular: Normal rate, regular rhythm, normal heart sounds and intact distal pulses.  Pulmonary/Chest: Effort normal and breath sounds normal.  Neurological: She is alert and oriented to person, place, and time. Gait normal.  Skin: Skin is warm and dry. No rash noted. Not diaphoretic.  Psychiatric: Mood, memory, affect and judgment normal.  Nursing note and vitals reviewed.  Urgent Care Treatments / Results:   Orders Placed This Encounter  Procedures  . Novel Coronavirus, NAA (Hosp order, Send-out to Ref Lab; TAT 18-24 hrs    LABS: PLEASE NOTE: all labs that were ordered this encounter are listed, however only abnormal results are displayed. Labs Reviewed  NOVEL CORONAVIRUS, NAA (HOSP ORDER, SEND-OUT TO REF LAB; TAT 18-24 HRS)    EKG: -None  RADIOLOGY: No results found.  PROCEDURES: Procedures  MEDICATIONS RECEIVED THIS VISIT: Medications - No data to display  PERTINENT CLINICAL COURSE NOTES/UPDATES:   Initial Impression / Assessment and Plan / Urgent Care Course:  Pertinent labs & imaging results that were available during my care of the patient were personally reviewed by me and considered in my medical decision making (see lab/imaging section of note for values and interpretations).  Nicole Turner is a 64 y.o. female who presents to [facility] today with requests for  COVID  Test  Patient overall well appearing and in no acute distress today in clinic. Presenting symptoms (see HPI) and exam as documented above. She presents with symptoms consistent with post-viral syndrome. She is requesting to be re-tested for SARS-CoV-2. PMH (+) SARS-CoV-2 (novel coronavirus) in the last 90 days (tested positive on 12/25/2018). Discussed that reinfection within 8 day window has not really be documented by the CDC, however it is possible and therefore remains part of the DDx. Patient advising that she felt like she had natural immunity for at least 90 days following her infection, however her father tested positive again yesterday (see HPI). Discussed that patient's can test (+) for months after being infected with the virus; CDC does not recommend re-testing for at least 90 days. Patient has no new symptoms, however is concerned citing that father tested negative after having the virus, but is now testing positive again per [facility]. Given exposure to her father, and concerns for short term re-infection with SARS-CoV-2, re-testing is reasonable for peace of mind. SARS-CoV-2 swab collected by certified clinical staff. Discussed variable turn around times associated with testing, as swabs are being processed at  [facility], and have been between 24-48 hours to come back. She was advised to self quarantine, per [facility] DHHS guidelines, until negative results received.   Recommended continued symptomatic supportive care. Patient to rest and continue to ensure adequate hydration (water and ORS) to prevent dehydration and any electrolyte derangements. May use Tylenol and/or Ibuprofen as needed for discomfort/headashes. Discussed that symptoms have been documented for week to months following SARS-CoV-2 infections. Reassurance provided.   Discussed follow up with primary care physician should she develop any concerning symptoms. I have reviewed the follow up and strict return precautions for any new or worsening  symptoms. Patient is aware of symptoms that would be deemed urgent/emergent, and would thus require further evaluation either here or in the emergency department. At the time of discharge, he verbalized understanding and consent with the discharge plan as it was reviewed with him. All questions were fielded by provider and/or clinic staff prior to patient discharge.    Final Clinical Impressions / Urgent Care Diagnoses:   Final diagnoses:  Post viral syndrome  History of 2019 novel coronavirus disease (COVID-19)  Encounter for laboratory testing for COVID-19 virus  Advice given about COVID-19 virus infection    New Prescriptions:  [place] Controlled Substance Registry consulted? Not Applicable  No orders of the defined types were placed in this encounter.   Recommended Follow up Care:  Patient encouraged to follow up with the following provider within the specified time frame, or sooner as dictated by the severity of her symptoms. As always, she was instructed that for any urgent/emergent care needs, she should seek care either here or in the emergency department for more immediate evaluation.  Follow-up Information    Danella Penton, MD In 1 week.   Specialty: Internal Medicine Why: General reassessment of symptoms if not improving Contact information: [address] [facility] [place] [address] [phone]         NOTE: This note was prepared using Dragon dictation software along with smaller phrase technology. Despite my best ability to proofread, there is the potential that transcriptional errors may still occur from this process, and are completely unintentional.    Verlee Monte, NP 02/09/19 1326

## 2019-02-10 LAB — NOVEL CORONAVIRUS, NAA (HOSP ORDER, SEND-OUT TO REF LAB; TAT 18-24 HRS): SARS-CoV-2, NAA: NOT DETECTED

## 2019-05-25 ENCOUNTER — Ambulatory Visit
Admission: EM | Admit: 2019-05-25 | Discharge: 2019-05-25 | Disposition: A | Discharge status: Home / Self Care | Payer: Medicare Other | Attending: Family Medicine | Admitting: Family Medicine

## 2019-05-25 ENCOUNTER — Ambulatory Visit (INDEPENDENT_AMBULATORY_CARE_PROVIDER_SITE_OTHER): Payer: Medicare Other

## 2019-05-25 ENCOUNTER — Other Ambulatory Visit: Payer: Self-pay

## 2019-05-25 DIAGNOSIS — S9032XA Contusion of left foot, initial encounter: Secondary | ICD-10-CM

## 2019-05-25 DIAGNOSIS — S90812A Abrasion, left foot, initial encounter: Secondary | ICD-10-CM | POA: Diagnosis not present

## 2019-05-25 NOTE — Discharge Instructions (Signed)
Keep skin abrasion clean and apply over the counter antibiotic twice daily Rest, ice, elevate, over the counter tylenol and/or ibuprofen as needed for pain

## 2019-05-25 NOTE — ED Triage Notes (Signed)
Patient complains of left foot pain that occurred after a heavy table fell on her foot around 12 pm. Patient states that pain has been constant.

## 2019-05-30 NOTE — ED Provider Notes (Signed)
[facility]    CSN: [id number] Arrival date & time: 05/25/19  1413      History   Chief Complaint Chief Complaint  Patient presents with  . Foot Pain    left    HPI Nicole Turner is a 64 y.o. female.   64 yo female with a c/o left foot pain since injuring it today. States a heavy table fell on her foot. Complains of pain and swelling since then. Has not taken any medications or applied ice.    Foot Pain    Past Medical History:  Diagnosis Date  . Asthma   . History of 2019 novel coronavirus disease (COVID-19) 12/25/2018  . Hyperlipidemia   . Osteoarthritis   . Surgical menopause     Patient Active Problem List   Diagnosis Date Noted  . Abdominal pain, chronic, epigastric 11/21/2015  . Hyperlipidemia 11/21/2015  . Ischemic colitis (HCC) 11/21/2015    Past Surgical History:  Procedure Laterality Date  . COLONOSCOPY    . COLONOSCOPY WITH PROPOFOL N/A 11/20/2015   Procedure: COLONOSCOPY WITH PROPOFOL;  Surgeon: Christena Deem, MD;  Location: [facility] ENDOSCOPY;  Service: Endoscopy;  Laterality: N/A;  . ESOPHAGOGASTRODUODENOSCOPY    . PERIPHERAL VASCULAR CATHETERIZATION N/A 11/24/2015   Procedure: Visceral Angiography;  Surgeon: Annice Needy, MD;  Location: [facility] INVASIVE CV LAB;  Service: Cardiovascular;  Laterality: N/A;  . VAGINAL HYSTERECTOMY      OB History   No obstetric history on file.      Home Medications    Prior to Admission medications   Medication Sig Start Date End Date Taking? Authorizing Provider  budesonide-formoterol (SYMBICORT) 80-4.5 MCG/ACT inhaler Inhale 2 puffs into the lungs 2 (two) times daily.   Yes [provider]  citalopram (CELEXA) 20 MG tablet Take 20 mg by mouth daily.   Yes [provider]  clopidogrel (PLAVIX) 75 MG tablet Take 1 tablet (75 mg total) by mouth daily. 11/24/15  Yes Dew, Marlow Baars, MD  estradiol (ESTRACE) 2 MG tablet Take 2 mg by mouth daily.   Yes [provider]    lovastatin (MEVACOR) 20 MG tablet Take 20 mg by mouth at bedtime.   Yes [provider]  mesalamine (LIALDA) 1.2 g EC tablet Take 1.2 g by mouth 2 (two) times daily.   Yes [provider]  montelukast (SINGULAIR) 10 MG tablet Take 10 mg by mouth at bedtime.   Yes [provider]    Family History Family History  Problem Relation Age of Onset  . Dementia Mother   . Heart disease Father   . Dementia Father   . Breast cancer Neg Hx     Social History Social History   Tobacco Use  . Smoking status: Former Smoker    Types: Cigarettes  . Smokeless tobacco: Never Used  Substance Use Topics  . Alcohol use: No  . Drug use: No     Allergies   Salicylates and Tramadol   Review of Systems Review of Systems   Physical Exam Triage Vital Signs ED Triage Vitals  Enc Vitals Group     BP 05/25/19 1526 (!) 153/63     Pulse Rate 05/25/19 1526 76     Resp 05/25/19 1526 18     Temp 05/25/19 1526 98.6 F (37 C)     Temp Source 05/25/19 1526 Oral     SpO2 05/25/19 1526 97 %     Weight --      Height --  Head Circumference --      Peak Flow --      Pain Score 05/25/19 1523 9     Pain Loc --      Pain Edu? --      Excl. in [place]? --    No data found.  Updated Vital Signs BP (!) 153/63 (BP Location: Left Arm)   Pulse 76   Temp 98.6 F (37 C) (Oral)   Resp 18   SpO2 97%   Visual Acuity Right Eye Distance:   Left Eye Distance:   Bilateral Distance:    Right Eye Near:   Left Eye Near:    Bilateral Near:     Physical Exam Vitals and nursing note reviewed.  Constitutional:      General: She is not in acute distress.    Appearance: She is not toxic-appearing or diaphoretic.  Musculoskeletal:     Left foot: Normal capillary refill. Swelling, tenderness and bony tenderness present. No deformity, foot drop, prominent metatarsal heads or crepitus. Lacerations: dorsal skin with mild abrasion. Normal pulse.     Comments: Left foot  neurovascularly intact  Neurological:     Mental Status: She is alert.      UC Treatments / Results  Labs (all labs ordered are listed, but only abnormal results are displayed) Labs Reviewed - No data to display  EKG   Radiology No results found.  Procedures Procedures (including critical care time)  Medications Ordered in UC Medications - No data to display  Initial Impression / Assessment and Plan / UC Course  I have reviewed the triage vital signs and the nursing notes.  Pertinent labs & imaging results that were available during my care of the patient were reviewed by me and considered in my medical decision making (see chart for details).      Final Clinical Impressions(s) / UC Diagnoses   Final diagnoses:  Contusion of left foot, initial encounter  Abrasion, left foot, initial encounter     Discharge Instructions     Keep skin abrasion clean and apply over the counter antibiotic twice daily Rest, ice, elevate, over the counter tylenol and/or ibuprofen as needed for pain    ED Prescriptions    None      1. x-ray results and diagnosis reviewed with patient 2. Recommend supportive treatment with RICE, otc analgesics prn 3. Follow-up prn if symptoms worsen or don't improve  PDMP not reviewed this encounter.   Norval Gable, MD 05/30/19 [phone]

## 2019-11-21 ENCOUNTER — Other Ambulatory Visit: Payer: Self-pay

## 2019-11-21 ENCOUNTER — Ambulatory Visit
Admission: EM | Admit: 2019-11-21 | Discharge: 2019-11-21 | Disposition: A | Discharge status: Home / Self Care | Payer: Medicare Other | Attending: Family Medicine | Admitting: Family Medicine

## 2019-11-21 ENCOUNTER — Encounter: Payer: Self-pay | Admitting: Emergency Medicine

## 2019-11-21 DIAGNOSIS — S60459A Superficial foreign body of unspecified finger, initial encounter: Secondary | ICD-10-CM

## 2019-11-21 HISTORY — DX: Rheumatoid arthritis, unspecified: M06.9

## 2019-11-21 NOTE — Discharge Instructions (Signed)
Keep clean (just soap and water).  You can remove dressing later today.  Take care  Dr. Adriana Simas

## 2019-11-21 NOTE — ED Triage Notes (Signed)
Patient in today c/o splinter in her left index finger x 1 day. Patient has iced the finger and has tried to dig it out with a needle.

## 2019-11-21 NOTE — ED Provider Notes (Signed)
[facility]    CSN: [id number] Arrival date & time: 11/21/19  5697      History   Chief Complaint Chief Complaint  Patient presents with  . Foreign Body in Skin   HPI  64 year old female presents with the above complaint.  Patient states that yesterday she got a splinter lodged in her left index finger.  She has applied ice and tried to remove it with a needle and has been unsuccessful.  She presents today for removal of the foreign body.  Pain 4/10 in severity.  No relieving factors.  No complaints.  Past Medical History:  Diagnosis Date  . Asthma   . History of 2019 novel coronavirus disease (COVID-19) 12/25/2018  . Hyperlipidemia   . Rheumatoid arthritis (HCC)   . Surgical menopause     Patient Active Problem List   Diagnosis Date Noted  . Abdominal pain, chronic, epigastric 11/21/2015  . Hyperlipidemia 11/21/2015  . Ischemic colitis (HCC) 11/21/2015    Past Surgical History:  Procedure Laterality Date  . COLONOSCOPY    . COLONOSCOPY WITH PROPOFOL N/A 11/20/2015   Procedure: COLONOSCOPY WITH PROPOFOL;  Surgeon: Christena Deem, MD;  Location: [facility] ENDOSCOPY;  Service: Endoscopy;  Laterality: N/A;  . ESOPHAGOGASTRODUODENOSCOPY    . PERIPHERAL VASCULAR CATHETERIZATION N/A 11/24/2015   Procedure: Visceral Angiography;  Surgeon: Annice Needy, MD;  Location: [facility] INVASIVE CV LAB;  Service: Cardiovascular;  Laterality: N/A;  . VAGINAL HYSTERECTOMY      OB History   No obstetric history on file.    Home Medications    Prior to Admission medications   Medication Sig Start Date End Date Taking? Authorizing Provider  budesonide-formoterol (SYMBICORT) 80-4.5 MCG/ACT inhaler Inhale 2 puffs into the lungs 2 (two) times daily.   Yes [provider]  citalopram (CELEXA) 20 MG tablet Take 20 mg by mouth daily.   Yes [provider]  clopidogrel (PLAVIX) 75 MG tablet Take 1 tablet (75 mg total) by mouth daily. 11/24/15  Yes Dew, Marlow Baars, MD    estradiol (ESTRACE) 2 MG tablet Take 2 mg by mouth daily.   Yes [provider]  furosemide (LASIX) 20 MG tablet Take 1 tablet by mouth daily as needed. 08/08/19  Yes [provider]  hydroxychloroquine (PLAQUENIL) 200 MG tablet Alternate 400mg  one day and 200mg  the next day. 09/20/19  Yes [provider]  lovastatin (MEVACOR) 20 MG tablet Take 20 mg by mouth at bedtime.   Yes [provider]  methotrexate (RHEUMATREX) 2.5 MG tablet Take by mouth. Take 6 tablet by mouth every 7 days 11/12/19  Yes [provider]  montelukast (SINGULAIR) 10 MG tablet Take 10 mg by mouth at bedtime.   Yes [provider]  PARoxetine (PAXIL) 20 MG tablet Take 20 mg by mouth daily. 11/06/19  Yes [provider]  rosuvastatin (CRESTOR) 5 MG tablet Take 5 mg by mouth daily. 11/05/19  Yes [provider]  mesalamine (LIALDA) 1.2 g EC tablet Take 1.2 g by mouth 2 (two) times daily.    [provider]    Family History Family History  Problem Relation Age of Onset  . Dementia Mother   . Hyperlipidemia Mother   . Heart disease Father   . Dementia Father   . Breast cancer Neg Hx     Social History Social History   Tobacco Use  . Smoking status: Former Smoker    Types: Cigarettes    Quit date: 11/21/1994  Years since quitting: 25.0  . Smokeless tobacco: Never Used  Vaping Use  . Vaping Use: Never used  Substance Use Topics  . Alcohol use: No  . Drug use: No     Allergies   Salicylates, Topiramate, and Tramadol   Review of Systems Review of Systems  Skin:       Foreign body - left index finger.   Physical Exam Triage Vital Signs ED Triage Vitals  Enc Vitals Group     BP 11/21/19 0919 117/60     Pulse Rate 11/21/19 0919 (!) 57     Resp 11/21/19 0919 18     Temp 11/21/19 0919 98.1 F (36.7 C)     Temp Source 11/21/19 0919 Oral     SpO2 11/21/19 0919 99 %     Weight 11/21/19 0920 150 lb (68 kg)     Height  11/21/19 0920 5\' 2"  (1.575 m)     Head Circumference --      Peak Flow --      Pain Score 11/21/19 0920 4     Pain Loc --      Pain Edu? --      Excl. in [place]? --    Updated Vital Signs BP 117/60 (BP Location: Left Arm)   Pulse (!) 57   Temp 98.1 F (36.7 C) (Oral)   Resp 18   Ht 5\' 2"  (1.575 m)   Wt 68 kg   SpO2 99%   BMI 27.44 kg/m   Visual Acuity Right Eye Distance:   Left Eye Distance:   Bilateral Distance:    Right Eye Near:   Left Eye Near:    Bilateral Near:     Physical Exam Vitals and nursing note reviewed.  Constitutional:      General: She is not in acute distress.    Appearance: Normal appearance. She is not ill-appearing.  HENT:     Head: Normocephalic and atraumatic.  Pulmonary:     Effort: Pulmonary effort is normal. No respiratory distress.  Skin:    Comments: Left index finger -tip of the finger slightly edematous.  Firm area noted.  Neurological:     Mental Status: She is alert.  Psychiatric:        Mood and Affect: Mood normal.        Behavior: Behavior normal.    UC Treatments / Results  Labs (all labs ordered are listed, but only abnormal results are displayed) Labs Reviewed - No data to display  EKG   Radiology No results found.  Procedures Foreign Body Removal  Date/Time: 11/21/2019 12:06 PM Performed by: , DO Authorized by: 11/23/2019, DO   Consent:    Consent obtained:  Verbal   Consent given by:  Patient Location:    Location:  Finger   Finger location:  L index finger   Tendon involvement:  None Pre-procedure details:    Imaging:  None   Neurovascular status: intact   Anesthesia (see MAR for exact dosages):    Anesthesia method:  Nerve block   Block needle gauge:  25 G   Block anesthetic:  Lidocaine 1% w/o epi   Block outcome:  Incomplete block Procedure type:    Procedure complexity:  Simple Procedure details:    Scalpel size:  11   Localization method:  Visualized   Removal mechanism:   Forceps   Foreign bodies recovered:  1   Intact foreign body removal: yes   Post-procedure details:  Neurovascular status: intact     Skin closure:  None   Dressing:  Non-adherent dressing   Patient tolerance of procedure:  Tolerated well, no immediate complications   (including critical care time)  Medications Ordered in UC Medications - No data to display  Initial Impression / Assessment and Plan / UC Course  I have reviewed the triage vital signs and the nursing notes.  Pertinent labs & imaging results that were available during my care of the patient were reviewed by me and considered in my medical decision making (see chart for details).    64 year old female presents with a foreign body in her right index finger.  Removed today.  See above.  Advised to keep clean.  Final Clinical Impressions(s) / UC Diagnoses   Final diagnoses:  Foreign body of finger     Discharge Instructions     Keep clean (just soap and water).  You can remove dressing later today.  Take care  Dr. Adriana Simas    ED Prescriptions    None     PDMP not reviewed this encounter.   Tommie Sams, DO 11/21/19 1210

## 2020-01-22 ENCOUNTER — Ambulatory Visit: Payer: Medicare Other | Admitting: Occupational Therapy

## 2020-01-29 ENCOUNTER — Ambulatory Visit: Payer: Medicare Other | Attending: Rheumatology | Admitting: Occupational Therapy

## 2020-03-31 ENCOUNTER — Encounter: Payer: Self-pay | Admitting: Emergency Medicine

## 2020-03-31 ENCOUNTER — Other Ambulatory Visit: Payer: Self-pay

## 2020-03-31 ENCOUNTER — Ambulatory Visit
Admission: EM | Admit: 2020-03-31 | Discharge: 2020-03-31 | Disposition: A | Discharge status: Home / Self Care | Payer: Medicare Other

## 2020-03-31 DIAGNOSIS — K122 Cellulitis and abscess of mouth: Secondary | ICD-10-CM | POA: Diagnosis not present

## 2020-03-31 MED ORDER — AMOXICILLIN-POT CLAVULANATE 875-125 MG PO TABS: 1.0000 | ORAL_TABLET | Freq: Two times a day (BID) | ORAL | 0 refills | Status: AC

## 2020-03-31 MED ORDER — TRIAMCINOLONE ACETONIDE 0.1 % MT PSTE: 1.0000 "application " | PASTE | Freq: Two times a day (BID) | OROMUCOSAL | 12 refills | Status: AC

## 2020-03-31 NOTE — Discharge Instructions (Addendum)
The appearance of the lesion is most consistent with a small abscess.  We are treating you at this time with Augmentin.  Take complete course.  I have also sent a corticosteroid paste to apply to the area to help reduce the swelling.  You should be seen again either later this week or early next week for a reexamination.  If this is not getting better with this treatment, another treatment may be tried or a referral to ENT.  If you develop a fever, increased swelling or pain or difficulty swallowing you need to go to the emergency department.

## 2020-03-31 NOTE — ED Provider Notes (Signed)
[facility]    CSN: [id number] Arrival date & time: 03/31/20  1038      History   Chief Complaint Chief Complaint  Patient presents with  . Mouth Lesions    HPI Nicole Turner is a 65 y.o. female presenting for swelling of an area of throat roof of her mouth x2 weeks.  Patient states that whenever she eats anything touches that spot she has significant pain.  Also admits to feeling like her throat is dry, but does not have any pain with swallowing.  Symptoms have not gotten any better or worse in the last 2 weeks.  Shows she is tried mouthwash but nothing else.  Patient is not a smoker.  She does have history of asthma, RA, and hyperlipidemia.  Patient says she is not currently using any inhalers.  She is not diabetic.  No history of canker sores or cold sores.  No other concerns.  HPI  Past Medical History:  Diagnosis Date  . Asthma   . History of 2019 novel coronavirus disease (COVID-19) 12/25/2018  . Hyperlipidemia   . Rheumatoid arthritis (HCC)   . Surgical menopause     Patient Active Problem List   Diagnosis Date Noted  . Abdominal pain, chronic, epigastric 11/21/2015  . Hyperlipidemia 11/21/2015  . Ischemic colitis (HCC) 11/21/2015    Past Surgical History:  Procedure Laterality Date  . COLONOSCOPY    . COLONOSCOPY WITH PROPOFOL N/A 11/20/2015   Procedure: COLONOSCOPY WITH PROPOFOL;  Surgeon: Christena Deem, MD;  Location: [facility] ENDOSCOPY;  Service: Endoscopy;  Laterality: N/A;  . ESOPHAGOGASTRODUODENOSCOPY    . PERIPHERAL VASCULAR CATHETERIZATION N/A 11/24/2015   Procedure: Visceral Angiography;  Surgeon: Annice Needy, MD;  Location: [facility] INVASIVE CV LAB;  Service: Cardiovascular;  Laterality: N/A;  . VAGINAL HYSTERECTOMY      OB History   No obstetric history on file.      Home Medications    Prior to Admission medications   Medication Sig Start Date End Date Taking? Authorizing Provider  amoxicillin-clavulanate (AUGMENTIN) 875-125 MG  tablet Take 1 tablet by mouth every 12 (twelve) hours for 7 days. 03/31/20 04/07/20 Yes Shirlee Latch, PA-C  budesonide-formoterol (SYMBICORT) 80-4.5 MCG/ACT inhaler Inhale 2 puffs into the lungs 2 (two) times daily.   Yes [provider]  clopidogrel (PLAVIX) 75 MG tablet Take 1 tablet (75 mg total) by mouth daily. 11/24/15  Yes Dew, Marlow Baars, MD  estradiol (ESTRACE) 2 MG tablet Take 2 mg by mouth daily.   Yes [provider]  furosemide (LASIX) 20 MG tablet Take 1 tablet by mouth daily as needed. 08/08/19  Yes [provider]  hydroxychloroquine (PLAQUENIL) 200 MG tablet Alternate 400mg  one day and 200mg  the next day. 09/20/19  Yes [provider]  mesalamine (LIALDA) 1.2 g EC tablet Take 1.2 g by mouth 2 (two) times daily.   Yes [provider]  methotrexate (RHEUMATREX) 2.5 MG tablet Take by mouth. Take 6 tablet by mouth every 7 days 11/12/19  Yes [provider]  montelukast (SINGULAIR) 10 MG tablet Take 10 mg by mouth at bedtime.   Yes [provider]  PARoxetine (PAXIL) 20 MG tablet Take 20 mg by mouth daily. 11/06/19  Yes [provider]  rosuvastatin (CRESTOR) 5 MG tablet Take 5 mg by mouth daily. 11/05/19  Yes [provider]  triamcinolone (KENALOG) 0.1 % paste Use as directed 1 application in the mouth or throat 2 (two) times daily for  7 days. 03/31/20 04/07/20 Yes Shirlee Latch, PA-C  citalopram (CELEXA) 20 MG tablet Take 20 mg by mouth daily.    [provider]  lovastatin (MEVACOR) 20 MG tablet Take 20 mg by mouth at bedtime.    [provider]  tiZANidine (ZANAFLEX) 4 MG tablet Take 4 mg by mouth 3 (three) times daily. 03/29/20   [provider]    Family History Family History  Problem Relation Age of Onset  . Dementia Mother   . Hyperlipidemia Mother   . Heart disease Father   . Dementia Father   . Breast cancer Neg Hx     Social History Social History   Tobacco Use  .  Smoking status: Former Smoker    Types: Cigarettes    Quit date: 11/21/1994    Years since quitting: 25.3  . Smokeless tobacco: Never Used  Vaping Use  . Vaping Use: Never used  Substance Use Topics  . Alcohol use: No  . Drug use: No     Allergies   Salicylates, Topiramate, and Tramadol   Review of Systems Review of Systems  Constitutional: Negative for fatigue and fever.  HENT: Positive for mouth sores. Negative for congestion, rhinorrhea and sore throat.   Gastrointestinal: Negative for nausea and vomiting.  Skin: Negative for wound.  Neurological: Negative for dizziness and headaches.  Hematological: Negative for adenopathy.     Physical Exam Triage Vital Signs ED Triage Vitals  Enc Vitals Group     BP 03/31/20 1054 (!) 158/66     Pulse Rate 03/31/20 1054 66     Resp 03/31/20 1054 18     Temp 03/31/20 1054 98.2 F (36.8 C)     Temp Source 03/31/20 1054 Oral     SpO2 03/31/20 1054 99 %     Weight 03/31/20 1052 149 lb 14.6 oz (68 kg)     Height 03/31/20 1052 5\' 2"  (1.575 m)     Head Circumference --      Peak Flow --      Pain Score 03/31/20 1051 4     Pain Loc --      Pain Edu? --      Excl. in [place]? --    No data found.  Updated Vital Signs BP (!) 158/66 (BP Location: Right Arm)   Pulse 66   Temp 98.2 F (36.8 C) (Oral)   Resp 18   Ht 5\' 2"  (1.575 m)   Wt 149 lb 14.6 oz (68 kg)   SpO2 99%   BMI 27.42 kg/m    Physical Exam Vitals and nursing note reviewed.  Constitutional:      General: She is not in acute distress.    Appearance: Normal appearance. She is not ill-appearing or toxic-appearing.  HENT:     Head: Normocephalic and atraumatic.     Nose: Nose normal.     Mouth/Throat:     Mouth: Mucous membranes are moist.     Dentition: Dental caries present. No dental tenderness or gingival swelling.     Palate: Lesions (there is a small erythematous, hard nodule of the hard palate that is tender to palpation) present.     Pharynx: Oropharynx is  clear.  Eyes:     General: No scleral icterus.       Right eye: No discharge.        Left eye: No discharge.     Conjunctiva/sclera: Conjunctivae normal.  Cardiovascular:     Rate and Rhythm: Normal  rate and regular rhythm.     Heart sounds: Normal heart sounds.  Pulmonary:     Effort: Pulmonary effort is normal. No respiratory distress.     Breath sounds: Normal breath sounds.  Musculoskeletal:     Cervical back: Neck supple.  Lymphadenopathy:     Cervical: No cervical adenopathy.  Skin:    General: Skin is dry.  Neurological:     General: No focal deficit present.     Mental Status: She is alert. Mental status is at baseline.     Motor: No weakness.     Gait: Gait normal.  Psychiatric:        Mood and Affect: Mood normal.        Behavior: Behavior normal.        Thought Content: Thought content normal.      UC Treatments / Results  Labs (all labs ordered are listed, but only abnormal results are displayed) Labs Reviewed - No data to display  EKG   Radiology No results found.  Procedures Procedures (including critical care time)  Medications Ordered in UC Medications - No data to display  Initial Impression / Assessment and Plan / UC Course  I have reviewed the triage vital signs and the nursing notes.  Pertinent labs & imaging results that were available during my care of the patient were reviewed by me and considered in my medical decision making (see chart for details).   65 year old female presenting for painful lesion of the hard palate x2 weeks.  Exam raises concerns for possible small abscess and cellulitis.  We will treat at this time with Augmentin.  Also sent triamcinolone paste to apply to the area to see if that will help with the swelling.  Advised patient that she needs close follow-up and advised her to follow-up with PCP later this week or early next week.  Advised if she cannot see PCP then she should return here.  ED precautions reviewed at  length with patient.  If not improving with this treatment, other concern would be possible mass/tumor or benign lesion.   Final Clinical Impressions(s) / UC Diagnoses   Final diagnoses:  Cellulitis and abscess of mouth     Discharge Instructions     The appearance of the lesion is most consistent with a small abscess.  We are treating you at this time with Augmentin.  Take complete course.  I have also sent a corticosteroid paste to apply to the area to help reduce the swelling.  You should be seen again either later this week or early next week for a reexamination.  If this is not getting better with this treatment, another treatment may be tried or a referral to ENT.  If you develop a fever, increased swelling or pain or difficulty swallowing you need to go to the emergency department.   ED Prescriptions    Medication Sig Dispense Auth. Provider   amoxicillin-clavulanate (AUGMENTIN) 875-125 MG tablet Take 1 tablet by mouth every 12 (twelve) hours for 7 days. 14 tablet Eusebio Friendly B, PA-C   triamcinolone (KENALOG) 0.1 % paste Use as directed 1 application in the mouth or throat 2 (two) times daily for 7 days. 5 g Shirlee Latch, PA-C     PDMP not reviewed this encounter.   Shirlee Latch, PA-C 03/31/20 1150

## 2020-03-31 NOTE — ED Triage Notes (Signed)
Pt c/o a sore on the roof of her mouth that started about 2 weeks ago. She states when she eats it burns. She also states her throat will get really dry no apparent reason.

## 2020-04-14 ENCOUNTER — Encounter: Payer: Self-pay | Admitting: Emergency Medicine

## 2020-04-14 ENCOUNTER — Other Ambulatory Visit: Payer: Self-pay

## 2020-04-14 ENCOUNTER — Ambulatory Visit
Admission: EM | Admit: 2020-04-14 | Discharge: 2020-04-14 | Disposition: A | Discharge status: Home / Self Care | Payer: Medicare Other | Attending: Physician Assistant | Admitting: Physician Assistant

## 2020-04-14 DIAGNOSIS — J45909 Unspecified asthma, uncomplicated: Secondary | ICD-10-CM | POA: Insufficient documentation

## 2020-04-14 DIAGNOSIS — Z8616 Personal history of COVID-19: Secondary | ICD-10-CM | POA: Diagnosis not present

## 2020-04-14 DIAGNOSIS — Z87891 Personal history of nicotine dependence: Secondary | ICD-10-CM | POA: Insufficient documentation

## 2020-04-14 DIAGNOSIS — I10 Essential (primary) hypertension: Secondary | ICD-10-CM | POA: Insufficient documentation

## 2020-04-14 DIAGNOSIS — J029 Acute pharyngitis, unspecified: Secondary | ICD-10-CM | POA: Diagnosis present

## 2020-04-14 DIAGNOSIS — U071 COVID-19: Secondary | ICD-10-CM | POA: Insufficient documentation

## 2020-04-14 DIAGNOSIS — M069 Rheumatoid arthritis, unspecified: Secondary | ICD-10-CM | POA: Diagnosis not present

## 2020-04-14 DIAGNOSIS — Z7902 Long term (current) use of antithrombotics/antiplatelets: Secondary | ICD-10-CM | POA: Insufficient documentation

## 2020-04-14 DIAGNOSIS — Z79899 Other long term (current) drug therapy: Secondary | ICD-10-CM | POA: Insufficient documentation

## 2020-04-14 DIAGNOSIS — R0981 Nasal congestion: Secondary | ICD-10-CM | POA: Diagnosis not present

## 2020-04-14 DIAGNOSIS — J069 Acute upper respiratory infection, unspecified: Secondary | ICD-10-CM | POA: Diagnosis not present

## 2020-04-14 DIAGNOSIS — Z7951 Long term (current) use of inhaled steroids: Secondary | ICD-10-CM | POA: Insufficient documentation

## 2020-04-14 LAB — GROUP A STREP BY PCR: Group A Strep by PCR: NOT DETECTED

## 2020-04-14 LAB — RAPID INFLUENZA A&B ANTIGENS
Influenza A (ARMC): NEGATIVE
Influenza B (ARMC): NEGATIVE

## 2020-04-14 MED ORDER — ACETAMINOPHEN 500 MG PO TABS
1000.0000 mg | ORAL_TABLET | Freq: Once | ORAL | Status: AC
  Administered 2020-04-14: 1000 mg via ORAL

## 2020-04-14 MED ORDER — FLUTICASONE PROPIONATE 50 MCG/ACT NA SUSP: 2.0000 | Freq: Every day | NASAL | 0 refills | Status: AC

## 2020-04-14 MED ORDER — PROMETHAZINE-DM 6.25-15 MG/5ML PO SYRP: 5.0000 mL | ORAL_SOLUTION | Freq: Four times a day (QID) | ORAL | 0 refills | Status: AC | PRN

## 2020-04-14 MED ORDER — LIDOCAINE VISCOUS HCL 2 % MT SOLN: 15.0000 mL | OROMUCOSAL | 0 refills | Status: AC | PRN

## 2020-04-14 NOTE — ED Triage Notes (Signed)
Pt states that she has nasal congestion, sore throat, HA, and slight cough . Pt states that her sx started yesterday

## 2020-04-14 NOTE — ED Provider Notes (Signed)
[facility]    CSN: [id number] Arrival date & time: 04/14/20  1057      History   Chief Complaint Chief Complaint  Patient presents with  . Headache  . Sore Throat  . Nasal Congestion    HPI Nicole Turner is a 65 y.o. female presenting for onset of headaches, chills, sore throat, nasal congestion, mild cough, and fatigue yesterday.  She states that her throat hurts "really bad."  Denies any fevers.  No chest pain or difficulty breathing.  No abdominal pain, nausea/vomiting or diarrhea.  No sick contacts and no known exposure to strep, influenza or COVID-19.  Has not taken any over-the-counter medication for symptoms.  Past medical history significant for asthma, rheumatoid arthritis, and hyperlipidemia.  She has no other complaints or concerns today.  HPI  Past Medical History:  Diagnosis Date  . Asthma   . History of 2019 novel coronavirus disease (COVID-19) 12/25/2018  . Hyperlipidemia   . Rheumatoid arthritis (HCC)   . Surgical menopause     Patient Active Problem List   Diagnosis Date Noted  . Abdominal pain, chronic, epigastric 11/21/2015  . Hyperlipidemia 11/21/2015  . Ischemic colitis (HCC) 11/21/2015    Past Surgical History:  Procedure Laterality Date  . COLONOSCOPY    . COLONOSCOPY WITH PROPOFOL N/A 11/20/2015   Procedure: COLONOSCOPY WITH PROPOFOL;  Surgeon: Christena Deem, MD;  Location: [facility] ENDOSCOPY;  Service: Endoscopy;  Laterality: N/A;  . ESOPHAGOGASTRODUODENOSCOPY    . PERIPHERAL VASCULAR CATHETERIZATION N/A 11/24/2015   Procedure: Visceral Angiography;  Surgeon: Annice Needy, MD;  Location: [facility] INVASIVE CV LAB;  Service: Cardiovascular;  Laterality: N/A;  . VAGINAL HYSTERECTOMY      OB History   No obstetric history on file.      Home Medications    Prior to Admission medications   Medication Sig Start Date End Date Taking? Authorizing Provider  fluticasone (FLONASE) 50 MCG/ACT nasal spray Place 2 sprays into both  nostrils daily for 15 days. 04/14/20 04/29/20 Yes Eusebio Friendly B, PA-C  lidocaine (XYLOCAINE) 2 % solution Use as directed 15 mLs in the mouth or throat every 3 (three) hours as needed for up to 5 days for mouth pain (swish and spit). 04/14/20 04/19/20 Yes Shirlee Latch, PA-C  promethazine-dextromethorphan (PROMETHAZINE-DM) 6.25-15 MG/5ML syrup Take 5 mLs by mouth 4 (four) times daily as needed for cough. 04/14/20  Yes Shirlee Latch, PA-C  budesonide-formoterol (SYMBICORT) 80-4.5 MCG/ACT inhaler Inhale 2 puffs into the lungs 2 (two) times daily.    [provider]  citalopram (CELEXA) 20 MG tablet Take 20 mg by mouth daily.    [provider]  clopidogrel (PLAVIX) 75 MG tablet Take 1 tablet (75 mg total) by mouth daily. 11/24/15   Annice Needy, MD  estradiol (ESTRACE) 2 MG tablet Take 2 mg by mouth daily.    [provider]  furosemide (LASIX) 20 MG tablet Take 1 tablet by mouth daily as needed. 08/08/19   [provider]  hydroxychloroquine (PLAQUENIL) 200 MG tablet Alternate  one day and  the next day. 09/20/19   [provider]  lovastatin (MEVACOR) 20 MG tablet Take 20 mg by mouth at bedtime.    [provider]  mesalamine (LIALDA) 1.2 g EC tablet Take 1.2 g by mouth 2 (two) times daily.    [provider]  methotrexate (RHEUMATREX) 2.5 MG tablet Take by mouth. Take 6 tablet by mouth every 7 days 11/12/19  [provider]  montelukast (SINGULAIR) 10 MG tablet Take 10 mg by mouth at bedtime.    [provider]  PARoxetine (PAXIL) 20 MG tablet Take 20 mg by mouth daily. 11/06/19   [provider]  rosuvastatin (CRESTOR) 5 MG tablet Take 5 mg by mouth daily. 11/05/19   [provider]  tiZANidine (ZANAFLEX) 4 MG tablet Take 4 mg by mouth 3 (three) times daily. 03/29/20   [provider]    Family History Family History  Problem Relation Age of Onset  . Dementia Mother   .  Hyperlipidemia Mother   . Heart disease Father   . Dementia Father   . Breast cancer Neg Hx     Social History Social History   Tobacco Use  . Smoking status: Former Smoker    Types: Cigarettes    Quit date: 11/21/1994    Years since quitting: 25.4  . Smokeless tobacco: Never Used  Vaping Use  . Vaping Use: Never used  Substance Use Topics  . Alcohol use: No  . Drug use: No     Allergies   Salicylates, Topiramate, and Tramadol   Review of Systems Review of Systems  Constitutional: Positive for chills and fatigue. Negative for diaphoresis and fever.  HENT: Positive for congestion, rhinorrhea and sore throat. Negative for ear pain, sinus pressure and sinus pain.   Respiratory: Positive for cough. Negative for shortness of breath.   Gastrointestinal: Negative for abdominal pain, nausea and vomiting.  Musculoskeletal: Negative for arthralgias and myalgias.  Skin: Negative for rash.  Neurological: Positive for headaches. Negative for weakness.  Hematological: Negative for adenopathy.     Physical Exam Triage Vital Signs ED Triage Vitals [04/14/20 1115]  Enc Vitals Group     BP (!) 173/73     Pulse Rate 93     Resp 18     Temp 98.9 F (37.2 C)     Temp Source Oral     SpO2 96 %     Weight      Height      Head Circumference      Peak Flow      Pain Score      Pain Loc      Pain Edu?      Excl. in GC?    No data found.  Updated Vital Signs BP (!) 173/73 (BP Location: Right Arm)   Pulse 93   Temp 98.9 F (37.2 C) (Oral)   Resp 18   SpO2 96%       Physical Exam Vitals and nursing note reviewed.  Constitutional:      General: She is not in acute distress.    Appearance: Normal appearance. She is well-developed. She is ill-appearing. She is not toxic-appearing.  HENT:     Head: Normocephalic and atraumatic.     Right Ear: Tympanic membrane, ear canal and external ear normal.     Left Ear: Tympanic membrane and ear canal normal.     Nose:  Congestion and rhinorrhea present.     Mouth/Throat:     Mouth: Mucous membranes are moist.     Pharynx: Oropharynx is clear. Posterior oropharyngeal erythema present.  Eyes:     General: No scleral icterus.       Right eye: No discharge.        Left eye: No discharge.     Conjunctiva/sclera: Conjunctivae normal.  Cardiovascular:     Rate and Rhythm: Normal rate and regular rhythm.  Heart sounds: Normal heart sounds.  Pulmonary:     Effort: Pulmonary effort is normal. No respiratory distress.     Breath sounds: Normal breath sounds. No wheezing, rhonchi or rales.  Musculoskeletal:     Cervical back: Neck supple.  Skin:    General: Skin is dry.  Neurological:     General: No focal deficit present.     Mental Status: She is alert. Mental status is at baseline.     Motor: No weakness.     Gait: Gait normal.  Psychiatric:        Mood and Affect: Mood normal.        Behavior: Behavior normal.        Thought Content: Thought content normal.      UC Treatments / Results  Labs (all labs ordered are listed, but only abnormal results are displayed) Labs Reviewed  GROUP A STREP BY PCR  RAPID INFLUENZA A&B ANTIGENS  SARS CORONAVIRUS 2 (TAT 6-24 HRS)    EKG   Radiology No results found.  Procedures Procedures (including critical care time)  Medications Ordered in UC Medications  acetaminophen (TYLENOL) tablet 1,000 mg (1,000 mg Oral Given 04/14/20 1140)    Initial Impression / Assessment and Plan / UC Course  I have reviewed the triage vital signs and the nursing notes.  Pertinent labs & imaging results that were available during my care of the patient were reviewed by me and considered in my medical decision making (see chart for details).   65 year old female presenting for onset of sore throat, headaches, chills, nasal congestion and fatigue yesterday.  Blood pressure is elevated at 173/73.  She is afebrile.  She is ill-appearing, but nontoxic.  Exam significant  for nasal congestion rhinorrhea, posterior pharyngeal erythema.  Her chest is clear to auscultation heart regular rate and rhythm.  Molecular strep test obtained. Rapid influenza testing obtained.  Patient given 1 g Tylenol for headache.  Strep test is negative.  Rapid flu test negative.  Covid test performed.  Current CDC guidelines, isolation protocol and ED precautions reviewed patient.  Advised that she likely has a viral URI.  Supportive care recommended at this time with increasing rest and fluids.  I did send Promethazine DM, Flonase, and Viscous Lidocaine.  Advised her to follow-up with [place] as needed for any worsening symptoms or if not better after 10 days.  BP is elevated again at 162/73 on recheck.  Advised to keep a log and follow-up with PCP about this this medication does need to be adjusted.  Final Clinical Impressions(s) / UC Diagnoses   Final diagnoses:  Viral upper respiratory tract infection  Sore throat  Nasal congestion  Essential hypertension     Discharge Instructions     URI/COLD SYMPTOMS: Your exam today is consistent with a viral illness. Antibiotics are not indicated at this time. Use medications as directed, including cough syrup, nasal saline, and decongestants. Your symptoms should improve over the next few days and resolve within 7-10 days. Increase rest and fluids. F/u if symptoms worsen or predominate such as sore throat, ear pain, productive cough, shortness of breath, or if you develop high fevers or worsening fatigue over the next several days.    You have received COVID testing today either for positive exposure, concerning symptoms that could be related to COVID infection, screening purposes, or re-testing after confirmed positive.  Your test obtained today checks for active viral infection in the last 1-2 weeks. If your test is negative now,  you can still test positive later. So, if you do develop symptoms you should either get re-tested and/or isolate  x 5 days and then strict mask use x 5 days (unvaccinated) or mask use x 10 days (vaccinated). Please follow CDC guidelines.  While Rapid antigen tests come back in 15-20 minutes, send out PCR/molecular test results typically come back within 1-3 days. In the mean time, if you are symptomatic, assume this could be a positive test and treat/monitor yourself as if you do have COVID.   We will call with test results if positive. Please download the MyChart app and set up a profile to access test results.   If symptomatic, go home and rest. Push fluids. Take Tylenol as needed for discomfort. Gargle warm salt water. Throat lozenges. Take Mucinex DM or Robitussin for cough. Humidifier in bedroom to ease coughing. Warm showers. Also review the COVID handout for more information.  COVID-19 INFECTION: The incubation period of COVID-19 is approximately 14 days after exposure, with most symptoms developing in roughly 4-5 days. Symptoms may range in severity from mild to critically severe. Roughly 80% of those infected will have mild symptoms. People of any age may become infected with COVID-19 and have the ability to transmit the virus. The most common symptoms include: fever, fatigue, cough, body aches, headaches, sore throat, nasal congestion, shortness of breath, nausea, vomiting, diarrhea, changes in smell and/or taste.    COURSE OF ILLNESS Some patients may begin with mild disease which can progress quickly into critical symptoms. If your symptoms are worsening please call ahead to the Emergency Department and proceed there for further treatment. Recovery time appears to be roughly 1-2 weeks for mild symptoms and 3-6 weeks for severe disease.   GO IMMEDIATELY TO ER FOR FEVER YOU ARE UNABLE TO GET DOWN WITH TYLENOL, BREATHING PROBLEMS, CHEST PAIN, FATIGUE, LETHARGY, INABILITY TO EAT OR DRINK, ETC  QUARANTINE AND ISOLATION: To help decrease the spread of COVID-19 please remain isolated if you have COVID  infection or are highly suspected to have COVID infection. This means -stay home and isolate to one room in the home if you live with others. Do not share a bed or bathroom with others while ill, sanitize and wipe down all countertops and keep common areas clean and disinfected. Stay home for 5 days. If you have no symptoms or your symptoms are resolving after 5 days, you can leave your house. Continue to wear a mask around others for 5 additional days. If you have been in close contact (within 6 feet) of someone diagnosed with COVID 19, you are advised to quarantine in your home for 14 days as symptoms can develop anywhere from 2-14 days after exposure to the virus. If you develop symptoms, you  must isolate.  Most current guidelines for COVID after exposure -unvaccinated: isolate 5 days and strict mask use x 5 days. Test on day 5 is possible -vaccinated: wear mask x 10 days if symptoms do not develop -You do not necessarily need to be tested for COVID if you have + exposure and  develop symptoms. Just isolate at home x10 days from symptom onset During this global pandemic, CDC advises to practice social distancing, try to stay at least 48ft away from others at all times. Wear a face covering. Wash and sanitize your hands regularly and avoid going anywhere that is not necessary.  KEEP IN MIND THAT THE COVID TEST IS NOT 100% ACCURATE AND YOU SHOULD STILL DO EVERYTHING TO PREVENT POTENTIAL  SPREAD OF VIRUS TO OTHERS (WEAR MASK, WEAR GLOVES, WASH HANDS AND SANITIZE REGULARLY). IF INITIAL TEST IS NEGATIVE, THIS MAY NOT MEAN YOU ARE DEFINITELY NEGATIVE. MOST ACCURATE TESTING IS DONE 5-7 DAYS AFTER EXPOSURE.   It is not advised by CDC to get re-tested after receiving a positive COVID test since you can still test positive for weeks to months after you have already cleared the virus.   *If you have not been vaccinated for COVID, I strongly suggest you consider getting vaccinated as long as there are no  contraindications.    BP IS ELEVATED. KEEP TAKING AT HOME MEDS. KEEP LOG OF BPS AND FOLLOW UP WITH PCP TO SEE IF MEDICATION NEEDS TO BE CHANGED OR DOSE ADJUSTED.    ED Prescriptions    Medication Sig Dispense Auth. Provider   promethazine-dextromethorphan (PROMETHAZINE-DM) 6.25-15 MG/5ML syrup Take 5 mLs by mouth 4 (four) times daily as needed for cough. 118 mL Eusebio Friendly B, PA-C   fluticasone (FLONASE) 50 MCG/ACT nasal spray Place 2 sprays into both nostrils daily for 15 days. 1 g Eusebio Friendly B, PA-C   lidocaine (XYLOCAINE) 2 % solution Use as directed 15 mLs in the mouth or throat every 3 (three) hours as needed for up to 5 days for mouth pain (swish and spit). 100 mL Shirlee Latch, PA-C     PDMP not reviewed this encounter.   Shirlee Latch, PA-C 04/14/20 1222

## 2020-04-14 NOTE — Discharge Instructions (Addendum)
URI/COLD SYMPTOMS: Your exam today is consistent with a viral illness. Antibiotics are not indicated at this time. Use medications as directed, including cough syrup, nasal saline, and decongestants. Your symptoms should improve over the next few days and resolve within 7-10 days. Increase rest and fluids. F/u if symptoms worsen or predominate such as sore throat, ear pain, productive cough, shortness of breath, or if you develop high fevers or worsening fatigue over the next several days.    You have received COVID testing today either for positive exposure, concerning symptoms that could be related to COVID infection, screening purposes, or re-testing after confirmed positive.  Your test obtained today checks for active viral infection in the last 1-2 weeks. If your test is negative now, you can still test positive later. So, if you do develop symptoms you should either get re-tested and/or isolate x 5 days and then strict mask use x 5 days (unvaccinated) or mask use x 10 days (vaccinated). Please follow CDC guidelines.  While Rapid antigen tests come back in 15-20 minutes, send out PCR/molecular test results typically come back within 1-3 days. In the mean time, if you are symptomatic, assume this could be a positive test and treat/monitor yourself as if you do have COVID.   We will call with test results if positive. Please download the MyChart app and set up a profile to access test results.   If symptomatic, go home and rest. Push fluids. Take Tylenol as needed for discomfort. Gargle warm salt water. Throat lozenges. Take Mucinex DM or Robitussin for cough. Humidifier in bedroom to ease coughing. Warm showers. Also review the COVID handout for more information.  COVID-19 INFECTION: The incubation period of COVID-19 is approximately 14 days after exposure, with most symptoms developing in roughly 4-5 days. Symptoms may range in severity from mild to critically severe. Roughly 80% of those infected  will have mild symptoms. People of any age may become infected with COVID-19 and have the ability to transmit the virus. The most common symptoms include: fever, fatigue, cough, body aches, headaches, sore throat, nasal congestion, shortness of breath, nausea, vomiting, diarrhea, changes in smell and/or taste.    COURSE OF ILLNESS Some patients may begin with mild disease which can progress quickly into critical symptoms. If your symptoms are worsening please call ahead to the Emergency Department and proceed there for further treatment. Recovery time appears to be roughly 1-2 weeks for mild symptoms and 3-6 weeks for severe disease.   GO IMMEDIATELY TO ER FOR FEVER YOU ARE UNABLE TO GET DOWN WITH TYLENOL, BREATHING PROBLEMS, CHEST PAIN, FATIGUE, LETHARGY, INABILITY TO EAT OR DRINK, ETC  QUARANTINE AND ISOLATION: To help decrease the spread of COVID-19 please remain isolated if you have COVID infection or are highly suspected to have COVID infection. This means -stay home and isolate to one room in the home if you live with others. Do not share a bed or bathroom with others while ill, sanitize and wipe down all countertops and keep common areas clean and disinfected. Stay home for 5 days. If you have no symptoms or your symptoms are resolving after 5 days, you can leave your house. Continue to wear a mask around others for 5 additional days. If you have been in close contact (within 6 feet) of someone diagnosed with COVID 19, you are advised to quarantine in your home for 14 days as symptoms can develop anywhere from 2-14 days after exposure to the virus. If you develop symptoms, you  must isolate.  Most current guidelines for COVID after exposure -unvaccinated: isolate 5 days and strict mask use x 5 days. Test on day 5 is possible -vaccinated: wear mask x 10 days if symptoms do not develop -You do not necessarily need to be tested for COVID if you have + exposure and  develop symptoms. Just isolate at  home x10 days from symptom onset During this global pandemic, CDC advises to practice social distancing, try to stay at least 75ft away from others at all times. Wear a face covering. Wash and sanitize your hands regularly and avoid going anywhere that is not necessary.  KEEP IN MIND THAT THE COVID TEST IS NOT 100% ACCURATE AND YOU SHOULD STILL DO EVERYTHING TO PREVENT POTENTIAL SPREAD OF VIRUS TO OTHERS (WEAR MASK, WEAR GLOVES, WASH HANDS AND SANITIZE REGULARLY). IF INITIAL TEST IS NEGATIVE, THIS MAY NOT MEAN YOU ARE DEFINITELY NEGATIVE. MOST ACCURATE TESTING IS DONE 5-7 DAYS AFTER EXPOSURE.   It is not advised by CDC to get re-tested after receiving a positive COVID test since you can still test positive for weeks to months after you have already cleared the virus.   *If you have not been vaccinated for COVID, I strongly suggest you consider getting vaccinated as long as there are no contraindications.    BP IS ELEVATED. KEEP TAKING AT HOME MEDS. KEEP LOG OF BPS AND FOLLOW UP WITH PCP TO SEE IF MEDICATION NEEDS TO BE CHANGED OR DOSE ADJUSTED.

## 2020-04-15 LAB — SARS CORONAVIRUS 2 (TAT 6-24 HRS): SARS Coronavirus 2: POSITIVE — AB

## 2020-04-16 ENCOUNTER — Telehealth: Payer: Self-pay

## 2020-04-16 NOTE — Telephone Encounter (Signed)
Called to discuss with patient about COVID-19 symptoms and the use of one of the available treatments for those with mild to moderate Covid symptoms and at a high risk of hospitalization.  Pt appears to qualify for outpatient treatment due to co-morbid conditions and/or a member of an at-risk group in accordance with the FDA Emergency Use Authorization.    Symptom onset: 04/13/20 Headache,congestion,sore throat Vaccinated: Unknown Booster? Unknown Immunocompromised? No Qualifiers: Asthma  Unable to reach pt - Left message and call back number [phone].  Nicole Turner

## 2020-04-18 ENCOUNTER — Other Ambulatory Visit: Payer: Self-pay

## 2020-04-18 ENCOUNTER — Emergency Department
Admission: EM | Admit: 2020-04-18 | Discharge: 2020-04-18 | Disposition: A | Discharge status: Home / Self Care | Payer: Medicare Other | Attending: Emergency Medicine | Admitting: Emergency Medicine

## 2020-04-18 DIAGNOSIS — Z87891 Personal history of nicotine dependence: Secondary | ICD-10-CM | POA: Diagnosis not present

## 2020-04-18 DIAGNOSIS — Z79899 Other long term (current) drug therapy: Secondary | ICD-10-CM | POA: Diagnosis not present

## 2020-04-18 DIAGNOSIS — Z8616 Personal history of COVID-19: Secondary | ICD-10-CM | POA: Diagnosis not present

## 2020-04-18 DIAGNOSIS — J45909 Unspecified asthma, uncomplicated: Secondary | ICD-10-CM | POA: Insufficient documentation

## 2020-04-18 DIAGNOSIS — S51011A Laceration without foreign body of right elbow, initial encounter: Secondary | ICD-10-CM | POA: Insufficient documentation

## 2020-04-18 DIAGNOSIS — L03113 Cellulitis of right upper limb: Secondary | ICD-10-CM | POA: Diagnosis not present

## 2020-04-18 DIAGNOSIS — W0110XA Fall on same level from slipping, tripping and stumbling with subsequent striking against unspecified object, initial encounter: Secondary | ICD-10-CM | POA: Insufficient documentation

## 2020-04-18 DIAGNOSIS — Z7902 Long term (current) use of antithrombotics/antiplatelets: Secondary | ICD-10-CM | POA: Insufficient documentation

## 2020-04-18 DIAGNOSIS — S59901A Unspecified injury of right elbow, initial encounter: Secondary | ICD-10-CM | POA: Diagnosis present

## 2020-04-18 DIAGNOSIS — S41111A Laceration without foreign body of right upper arm, initial encounter: Secondary | ICD-10-CM

## 2020-04-18 DIAGNOSIS — W19XXXA Unspecified fall, initial encounter: Secondary | ICD-10-CM

## 2020-04-18 MED ORDER — SULFAMETHOXAZOLE-TRIMETHOPRIM 800-160 MG PO TABS: 1.0000 | ORAL_TABLET | Freq: Two times a day (BID) | ORAL | 0 refills | Status: AC

## 2020-04-18 MED ORDER — SULFAMETHOXAZOLE-TRIMETHOPRIM 800-160 MG PO TABS
1.0000 | ORAL_TABLET | Freq: Once | ORAL | Status: AC
  Administered 2020-04-18: 1 via ORAL
  Filled 2020-04-18: qty 1

## 2020-04-18 NOTE — ED Notes (Signed)
Pt diagnosed with COVID on Monday

## 2020-04-18 NOTE — ED Notes (Signed)
See triage note  Presents with injury to right elbow  States she is not sure what she hit arm on something  Unsure what she hit  States she got up to close back door bent down and hit something

## 2020-04-18 NOTE — ED Triage Notes (Signed)
Pt to ED POV for fall a few days ago, hitting right elbow. Denies hitting head  +blood thinners Pt states fell d/t taking night time meds that make her sleepy Bleeding controlled at this time, laceration/skin tear noted to right elbow  Tested COVID + Monday

## 2020-04-18 NOTE — ED Provider Notes (Signed)
[facility] Emergency Department Provider Note  ____________________________________________  Time seen: Approximately 5:42 PM  I have reviewed the triage vital signs and the nursing notes.   HISTORY  Chief Complaint Fall    HPI Nicole Turner is a 65 y.o. female who presents the emergency department for evaluation of a wound to the right elbow.  Patient states that 2 nights ago she had gotten up to take care of something in the house, had taken her nighttime medications and was pretty drowsy.  Patient states that she bent over to close a window, when she stood up she became very dizzy and fell.  During the fall she struck her arm on an unknown object causing a skin tear.  She states that initially she was able to control the bleeding easily, did not feel that she needed to be seen.  Patient has had some increasing pain and now surrounding erythema of this injury.  The injury occurred just distal to the elbow joint itself.  There is no described elbow pain.  No fevers or chills.  No drainage from the wound.  Up-to-date on immunizations.  Patient denies any other complaints at this time.         Past Medical History:  Diagnosis Date  . Asthma   . History of 2019 novel coronavirus disease (COVID-19) 12/25/2018  . Hyperlipidemia   . Rheumatoid arthritis (HCC)   . Surgical menopause     Patient Active Problem List   Diagnosis Date Noted  . Abdominal pain, chronic, epigastric 11/21/2015  . Hyperlipidemia 11/21/2015  . Ischemic colitis (HCC) 11/21/2015    Past Surgical History:  Procedure Laterality Date  . COLONOSCOPY    . COLONOSCOPY WITH PROPOFOL N/A 11/20/2015   Procedure: COLONOSCOPY WITH PROPOFOL;  Surgeon: Christena Deem, MD;  Location: [facility] ENDOSCOPY;  Service: Endoscopy;  Laterality: N/A;  . ESOPHAGOGASTRODUODENOSCOPY    . PERIPHERAL VASCULAR CATHETERIZATION N/A 11/24/2015   Procedure: Visceral Angiography;  Surgeon: Annice Needy, MD;   Location: [facility] INVASIVE CV LAB;  Service: Cardiovascular;  Laterality: N/A;  . VAGINAL HYSTERECTOMY      Prior to Admission medications   Medication Sig Start Date End Date Taking? Authorizing Provider  sulfamethoxazole-trimethoprim (BACTRIM DS) 800-160 MG tablet Take 1 tablet by mouth 2 (two) times daily. 04/18/20  Yes Nyhla Mountjoy, Delorise Royals, PA-C  budesonide-formoterol (SYMBICORT) 80-4.5 MCG/ACT inhaler Inhale 2 puffs into the lungs 2 (two) times daily.    [provider]  clopidogrel (PLAVIX) 75 MG tablet Take 1 tablet (75 mg total) by mouth daily. 11/24/15   Annice Needy, MD  estradiol (ESTRACE) 2 MG tablet Take 2 mg by mouth daily.    [provider]  fluticasone (FLONASE) 50 MCG/ACT nasal spray Place 2 sprays into both nostrils daily for 15 days. 04/14/20 04/29/20  Eusebio Friendly B, PA-C  furosemide (LASIX) 20 MG tablet Take 1 tablet by mouth daily as needed. 08/08/19   [provider]  hydroxychloroquine (PLAQUENIL) 200 MG tablet Alternate 400mg  one day and 200mg  the next day. 09/20/19   [provider]  lidocaine (XYLOCAINE) 2 % solution Use as directed 15 mLs in the mouth or throat every 3 (three) hours as needed for up to 5 days for mouth pain (swish and spit). 04/14/20 04/19/20  06/14/20, PA-C  lovastatin (MEVACOR) 20 MG tablet Take 20 mg by mouth at bedtime.    [provider]  mesalamine (LIALDA) 1.2 g EC tablet Take 1.2 g by  mouth 2 (two) times daily.    [provider]  methotrexate (RHEUMATREX) 2.5 MG tablet Take by mouth. Take 6 tablet by mouth every 7 days 11/12/19   [provider]  montelukast (SINGULAIR) 10 MG tablet Take 10 mg by mouth at bedtime.    [provider]  PARoxetine (PAXIL) 20 MG tablet Take 20 mg by mouth daily. 11/06/19   [provider]  promethazine-dextromethorphan (PROMETHAZINE-DM) 6.25-15 MG/5ML syrup Take 5 mLs by mouth 4 (four) times daily as needed for cough. 04/14/20   Eusebio Friendly B, PA-C  rosuvastatin (CRESTOR) 5 MG tablet Take 5 mg by mouth daily. 11/05/19   [provider]  tiZANidine (ZANAFLEX) 4 MG tablet Take 4 mg by mouth 3 (three) times daily. 03/29/20   [provider]    Allergies Salicylates, Topiramate, and Tramadol  Family History  Problem Relation Age of Onset  . Dementia Mother   . Hyperlipidemia Mother   . Heart disease Father   . Dementia Father   . Breast cancer Neg Hx     Social History Social History   Tobacco Use  . Smoking status: Former Smoker    Types: Cigarettes    Quit date: 11/21/1994    Years since quitting: 25.4  . Smokeless tobacco: Never Used  Vaping Use  . Vaping Use: Never used  Substance Use Topics  . Alcohol use: No  . Drug use: No     Review of Systems  Constitutional: No fever/chills Eyes: No visual changes. No discharge ENT: No upper respiratory complaints. Cardiovascular: no chest pain. Respiratory: no cough. No SOB. Gastrointestinal: No abdominal pain.  No nausea, no vomiting.  No diarrhea.  No constipation. Musculoskeletal: Skin tear to the right elbow Skin: Negative for rash, abrasions, lacerations, ecchymosis. Neurological: Negative for headaches, focal weakness or numbness.  10 System ROS otherwise negative.  ____________________________________________   PHYSICAL EXAM:  VITAL SIGNS: ED Triage Vitals  Enc Vitals Group     BP 04/18/20 1649 (!) 152/69     Pulse Rate 04/18/20 1647 73     Resp 04/18/20 1649 18     Temp 04/18/20 1647 98.3 F (36.8 C)     Temp Source 04/18/20 1647 Oral     SpO2 04/18/20 1647 97 %     Weight 04/18/20 1648 160 lb (72.6 kg)     Height 04/18/20 1648 5\' 2"  (1.575 m)     Head Circumference --      Peak Flow --      Pain Score 04/18/20 1647 4     Pain Loc --      Pain Edu? --      Excl. in GC? --      Constitutional: Alert and oriented. Well appearing and in no acute distress. Eyes: Conjunctivae are normal. PERRL. EOMI. Head:  Atraumatic. ENT:      Ears:       Nose: No congestion/rhinnorhea.      Mouth/Throat: Mucous membranes are moist.  Neck: No stridor.    Cardiovascular: Normal rate, regular rhythm. Normal S1 and S2.  Good peripheral circulation. Respiratory: Normal respiratory effort without tachypnea or retractions. Lungs CTAB. Good air entry to the bases with no decreased or absent breath sounds. Musculoskeletal: Full range of motion to all extremities. No gross deformities appreciated.  Visualization of the right elbow revealed a skin tear just distal to the joint itself.  This occurs on the posterior lateral aspect of the arm.  Patient does have exposed  subcutaneous tissue, granulation tissue is forming at this time.  Patient does have erythema along the edges consistent with infection.  Erythema regions are tender to palpation.  No palpable underlying abnormality.  Full range of motion to the elbow joint with no tenderness over the elbow joint cell.  No erythema or edema of the joint.  There is no purulent drainage.  No fluctuance or induration. Neurologic:  Normal speech and language. No gross focal neurologic deficits are appreciated.  Skin:  Skin is warm, dry and intact. No rash noted. Psychiatric: Mood and affect are normal. Speech and behavior are normal. Patient exhibits appropriate insight and judgement.   ____________________________________________   LABS (all labs ordered are listed, but only abnormal results are displayed)  Labs Reviewed - No data to display ____________________________________________  EKG   ____________________________________________  RADIOLOGY   No results found.  ____________________________________________    PROCEDURES  Procedure(s) performed:    Procedures    Medications  sulfamethoxazole-trimethoprim (BACTRIM DS) 800-160 MG per tablet 1 tablet (has no administration in time range)     ____________________________________________   INITIAL  IMPRESSION / ASSESSMENT AND PLAN / ED COURSE  Pertinent labs & imaging results that were available during my care of the patient were reviewed by me and considered in my medical decision making (see chart for details).  Review of the [place] CSRS was performed in accordance of the [facility] prior to dispensing any controlled drugs.           Patient's diagnosis is consistent with skin tear with infection.  Patient presented to the emergency department complaining of skin tear to the right arm.  This is just inferior to the elbow joint itself.  There is no evidence that infection has caused a septic joint.  Patient has a skin tear that shows signs of granulation tissue and is unable to be closed.  Area is cleansed, covered at this time.  Patient be started on antibiotics.  Patiently placed on Bactrim for antibiotic coverage.  No history of chronic kidney disease in the last labs reveal healthy kidney function.  Wound care instructions discussed with the patient.  Return precautions are discussed at length with the patient to include worsening erythema, purulent drainage, although pain or edema/erythema of the joint itself.  Patient verbalizes understanding of same.  Otherwise follow-up with primary care.. Patient is given ED precautions to return to the ED for any worsening or new symptoms.     ____________________________________________  FINAL CLINICAL IMPRESSION(S) / ED DIAGNOSES  Final diagnoses:  Skin tear of right upper extremity  Cellulitis of right upper extremity  Fall, initial encounter      NEW MEDICATIONS STARTED DURING THIS VISIT:  ED Discharge Orders         Ordered    sulfamethoxazole-trimethoprim (BACTRIM DS) 800-160 MG tablet  2 times daily        04/18/20 1759              This chart was dictated using voice recognition software/Dragon. Despite best efforts to proofread, errors can occur which can change the meaning. Any change was purely unintentional.     Racheal Patches, PA-C 04/18/20 1803    Phineas Semen, MD 04/18/20 1929

## 2020-04-25 ENCOUNTER — Other Ambulatory Visit: Admission: RE | Admit: 2020-04-25 | Payer: Medicare Other | Source: Ambulatory Visit

## 2020-06-30 ENCOUNTER — Encounter: Payer: Self-pay | Admitting: *Deleted

## 2020-07-01 ENCOUNTER — Other Ambulatory Visit: Payer: Self-pay

## 2020-07-01 ENCOUNTER — Encounter: Payer: Self-pay | Admitting: *Deleted

## 2020-07-01 ENCOUNTER — Ambulatory Visit
Admission: RE | Admit: 2020-07-01 | Discharge: 2020-07-01 | Disposition: A | Discharge status: Home / Self Care | Payer: Medicare Other | Attending: Gastroenterology | Admitting: Gastroenterology

## 2020-07-01 ENCOUNTER — Ambulatory Visit: Payer: Medicare Other | Admitting: Anesthesiology

## 2020-07-01 ENCOUNTER — Encounter
Admission: RE | Disposition: A | Discharge status: Home / Self Care | Payer: Self-pay | Source: Home / Self Care | Attending: Gastroenterology

## 2020-07-01 DIAGNOSIS — Z885 Allergy status to narcotic agent status: Secondary | ICD-10-CM | POA: Insufficient documentation

## 2020-07-01 DIAGNOSIS — Z8616 Personal history of COVID-19: Secondary | ICD-10-CM | POA: Insufficient documentation

## 2020-07-01 DIAGNOSIS — K2100 Gastro-esophageal reflux disease with esophagitis, without bleeding: Secondary | ICD-10-CM | POA: Diagnosis not present

## 2020-07-01 DIAGNOSIS — K5730 Diverticulosis of large intestine without perforation or abscess without bleeding: Secondary | ICD-10-CM | POA: Diagnosis not present

## 2020-07-01 DIAGNOSIS — Z79899 Other long term (current) drug therapy: Secondary | ICD-10-CM | POA: Diagnosis not present

## 2020-07-01 DIAGNOSIS — Z7951 Long term (current) use of inhaled steroids: Secondary | ICD-10-CM | POA: Insufficient documentation

## 2020-07-01 DIAGNOSIS — K449 Diaphragmatic hernia without obstruction or gangrene: Secondary | ICD-10-CM | POA: Diagnosis not present

## 2020-07-01 DIAGNOSIS — K5900 Constipation, unspecified: Secondary | ICD-10-CM | POA: Diagnosis not present

## 2020-07-01 DIAGNOSIS — R6881 Early satiety: Secondary | ICD-10-CM | POA: Diagnosis not present

## 2020-07-01 DIAGNOSIS — M069 Rheumatoid arthritis, unspecified: Secondary | ICD-10-CM | POA: Insufficient documentation

## 2020-07-01 DIAGNOSIS — Z6826 Body mass index (BMI) 26.0-26.9, adult: Secondary | ICD-10-CM | POA: Diagnosis not present

## 2020-07-01 DIAGNOSIS — Z888 Allergy status to other drugs, medicaments and biological substances status: Secondary | ICD-10-CM | POA: Insufficient documentation

## 2020-07-01 DIAGNOSIS — Z7902 Long term (current) use of antithrombotics/antiplatelets: Secondary | ICD-10-CM | POA: Diagnosis not present

## 2020-07-01 DIAGNOSIS — R634 Abnormal weight loss: Secondary | ICD-10-CM | POA: Insufficient documentation

## 2020-07-01 HISTORY — PX: ESOPHAGOGASTRODUODENOSCOPY (EGD) WITH PROPOFOL: SHX5813

## 2020-07-01 HISTORY — PX: COLONOSCOPY WITH PROPOFOL: SHX5780

## 2020-07-01 HISTORY — DX: Diverticulosis of intestine, part unspecified, without perforation or abscess without bleeding: K57.90

## 2020-07-01 SURGERY — ESOPHAGOGASTRODUODENOSCOPY (EGD) WITH PROPOFOL
Anesthesia: General

## 2020-07-01 MED ORDER — PROPOFOL 10 MG/ML IV BOLUS
INTRAVENOUS | Status: DC | PRN
  Administered 2020-07-01: 50 mg via INTRAVENOUS

## 2020-07-01 MED ORDER — PROPOFOL 500 MG/50ML IV EMUL
INTRAVENOUS | Status: AC
  Filled 2020-07-01: qty 50

## 2020-07-01 MED ORDER — PROPOFOL 500 MG/50ML IV EMUL
INTRAVENOUS | Status: DC | PRN
  Administered 2020-07-01: 150 ug/kg/min via INTRAVENOUS

## 2020-07-01 MED ORDER — SODIUM CHLORIDE 0.9 % IV SOLN: INTRAVENOUS | Status: DC

## 2020-07-01 NOTE — Op Note (Signed)
[facility] Gastroenterology Patient Name: Nicole Turner Procedure Date: 07/01/2020 10:58 AM MRN: [id number] Account #: [id number] Date of Birth: 09-25-1955 Admit Type: Outpatient Age: 65 Room: [facility] ENDO ROOM 1 Gender: Female Note Status: Finalized Procedure:             Colonoscopy Indications:           Constipation, Weight loss Providers:             Andrey Farmer MD, MD Referring MD:          Sofie Hartigan (Referring MD) Medicines:             Monitored Anesthesia Care Complications:         No immediate complications. Procedure:             Pre-Anesthesia Assessment:                        - Prior to the procedure, a History and Physical was                         performed, and patient medications and allergies were                         reviewed. The patient is competent. The risks and                         benefits of the procedure and the sedation options and                         risks were discussed with the patient. All questions                         were answered and informed consent was obtained.                         Patient identification and proposed procedure were                         verified by the physician, the nurse, the anesthetist                         and the technician in the endoscopy suite. Mental                         Status Examination: alert and oriented. Airway                         Examination: normal oropharyngeal airway and neck                         mobility. Respiratory Examination: clear to                         auscultation. CV Examination: normal. Prophylactic                         Antibiotics: The patient does not require prophylactic  antibiotics. Prior Anticoagulants: The patient has                         taken Plavix (clopidogrel), last dose was 1 day prior                         to procedure. ASA Grade Assessment: III - A patient                         with  severe systemic disease. After reviewing the                         risks and benefits, the patient was deemed in                         satisfactory condition to undergo the procedure. The                         anesthesia plan was to use monitored anesthesia care                         (MAC). Immediately prior to administration of                         medications, the patient was re-assessed for adequacy                         to receive sedatives. The heart rate, respiratory                         rate, oxygen saturations, blood pressure, adequacy of                         pulmonary ventilation, and response to care were                         monitored throughout the procedure. The physical                         status of the patient was re-assessed after the                         procedure.                        After obtaining informed consent, the colonoscope was                         passed under direct vision. Throughout the procedure,                         the patient's blood pressure, pulse, and oxygen                         saturations were monitored continuously. The                         Colonoscope was introduced through the anus and  advanced to the the terminal ileum. The colonoscopy                         was performed without difficulty. The patient                         tolerated the procedure well. The quality of the bowel                         preparation was poor. Findings:      The perianal and digital rectal examinations were normal.      Scattered small-mouthed diverticula were found in the sigmoid colon,       descending colon and ascending colon.      Semi-liquid stool was found in the entire colon, precluding       visualization. Lavage of the area was performed using a large amount,       resulting in incomplete clearance with continued poor visualization. Impression:            - Preparation of the colon was  poor.                        - Diverticulosis in the sigmoid colon, in the                         descending colon and in the ascending colon.                        - Stool in the entire examined colon.                        - No specimens collected. Recommendation:        - Discharge patient to home.                        - Resume previous diet.                        - Continue present medications.                        - Repeat colonoscopy at the next available appointment                         because the bowel preparation was suboptimal.                        - Return to referring physician as previously                         scheduled. Procedure Code(s):     --- Professional ---                        [phone], Colonoscopy, flexible; diagnostic, including                         collection of specimen(s) by brushing or washing, when                         performed (separate procedure)  Diagnosis Code(s):     --- Professional ---                        K59.00, Constipation, unspecified                        R63.4, Abnormal weight loss                        K57.30, Diverticulosis of large intestine without                         perforation or abscess without bleeding CPT copyright 2019 American Medical Association. All rights reserved. The codes documented in this report are preliminary and upon coder review may  be revised to meet current compliance requirements. Andrey Farmer MD, MD 07/01/2020 11:38:51 AM Number of Addenda: 0 Note Initiated On: 07/01/2020 10:58 AM Scope Withdrawal Time: 0 hours 6 minutes 20 seconds  Total Procedure Duration: 0 hours 11 minutes 35 seconds  Estimated Blood Loss:  Estimated blood loss: none.      [facility]

## 2020-07-01 NOTE — Transfer of Care (Signed)
Immediate Anesthesia Transfer of Care Note  Patient: Nicole Turner  Procedure(s) Performed: ESOPHAGOGASTRODUODENOSCOPY (EGD) WITH PROPOFOL COLONOSCOPY WITH PROPOFOL  Patient Location: PACU  Anesthesia Type:General  Level of Consciousness: awake and alert   Airway & Oxygen Therapy: Patient Spontanous Breathing and Patient connected to nasal cannula oxygen  Post-op Assessment: Report given to RN and Post -op Vital signs reviewed and stable  Post vital signs: Reviewed and stable  Last Vitals:  Vitals Value Taken Time  BP 118/63 07/01/20 1133  Temp    Pulse 80 07/01/20 1133  Resp 17 07/01/20 1133  SpO2 99 % 07/01/20 1133  Vitals shown include unvalidated device data.  Last Pain:  Vitals:   07/01/20 1133  TempSrc:   PainSc: 0-No pain         Complications: No notable events documented.

## 2020-07-01 NOTE — Anesthesia Postprocedure Evaluation (Signed)
Anesthesia Post Note  Patient: KEMYRA AUGUST  Procedure(s) Performed: ESOPHAGOGASTRODUODENOSCOPY (EGD) WITH PROPOFOL COLONOSCOPY WITH PROPOFOL  Patient location during evaluation: Endoscopy Anesthesia Type: General Level of consciousness: awake and alert Pain management: pain level controlled Vital Signs Assessment: post-procedure vital signs reviewed and stable Respiratory status: spontaneous breathing, nonlabored ventilation, respiratory function stable and patient connected to nasal cannula oxygen Cardiovascular status: blood pressure returned to baseline and stable Postop Assessment: no apparent nausea or vomiting Anesthetic complications: no   No notable events documented.   Last Vitals:  Vitals:   07/01/20 1153 07/01/20 1203  BP: 133/67 (!) 126/97  Pulse: 73 73  Resp: 20 16  Temp:    SpO2: 99% 97%    Last Pain:  Vitals:   07/01/20 1203  TempSrc:   PainSc: 0-No pain                 Lenard Simmer

## 2020-07-01 NOTE — Anesthesia Preprocedure Evaluation (Signed)
Anesthesia Evaluation  Patient identified by MRN, date of birth, ID band Patient awake    Reviewed: Allergy & Precautions, H&P , NPO status , Patient's Chart, lab work & pertinent test results, reviewed documented beta blocker date and time   History of Anesthesia Complications Negative for: history of anesthetic complications  Airway Mallampati: II   Neck ROM: full    Dental  (+) Poor Dentition, Dental Advidsory Given   Pulmonary neg pulmonary ROS, neg shortness of breath, asthma , neg recent URI, former smoker,    Pulmonary exam normal        Cardiovascular Exercise Tolerance: Good negative cardio ROS Normal cardiovascular exam     Neuro/Psych negative neurological ROS  negative psych ROS   GI/Hepatic negative GI ROS, Neg liver ROS,   Endo/Other  negative endocrine ROS  Renal/GU negative Renal ROS  negative genitourinary   Musculoskeletal   Abdominal   Peds  Hematology negative hematology ROS (+)   Anesthesia Other Findings Past Medical History: No date: Asthma No date: Hyperlipidemia No date: Osteoarthritis 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 Past Surgical History: No date: COLONOSCOPY No date: ESOPHAGOGASTRODUODENOSCOPY No date: VAGINAL HYSTERECTOMY   Reproductive/Obstetrics                             Anesthesia Physical  Anesthesia Plan  ASA: 2  Anesthesia Plan: General   Post-op Pain Management:    Induction: Intravenous  PONV Risk Score and Plan: 3 and TIVA and Propofol infusion  Airway Management Planned: Natural Airway and Nasal Cannula  Additional Equipment:   Intra-op Plan:   Post-operative Plan:   Informed Consent: I have reviewed the patients History and Physical, chart, labs and discussed the procedure including the risks, benefits and alternatives for the proposed anesthesia with the patient or authorized representative who has indicated his/her understanding  and acceptance.     Dental Advisory Given  Plan Discussed with: CRNA  Anesthesia Plan Comments:         Anesthesia Quick Evaluation

## 2020-07-01 NOTE — H&P (Signed)
Outpatient short stay form Pre-procedure 07/01/2020 10:58 AM Merlyn Lot MD, MPH  Primary Physician: Dr. Hyacinth Meeker  Reason for visit:  Early Satiety/constipation  History of present illness:   65 y/o lady with history of RA and ischemic colitis here for EGD/Colonoscopy for early satiety and constipation. Has history of hysterectomy. Takes plavix with last dose yesterday. Explained to patient that we would not be able to remove any large polyps if found and she agreed to proceed with the procedure. No family history of GI malignancies. History of hysterectomy.    Current Facility-Administered Medications:    0.9 %  sodium chloride infusion, , Intravenous, Continuous, Shantal Roan, Rossie Muskrat, MD, Last Rate: 20 mL/hr at 07/01/20 1019, New Bag at 07/01/20 1019  Medications Prior to Admission  Medication Sig Dispense Refill Last Dose   budesonide-formoterol (SYMBICORT) 80-4.5 MCG/ACT inhaler Inhale 2 puffs into the lungs 2 (two) times daily.   Past Month   clopidogrel (PLAVIX) 75 MG tablet Take 1 tablet (75 mg total) by mouth daily. 30 tablet 11 06/30/2020   fluticasone-salmeterol (ADVAIR) 250-50 MCG/ACT AEPB Inhale 1 puff into the lungs in the morning and at bedtime.   Past Month   furosemide (LASIX) 20 MG tablet Take 1 tablet by mouth daily as needed.   Past Month   hydroxychloroquine (PLAQUENIL) 200 MG tablet Alternate 400mg  one day and 200mg  the next day.   06/30/2020   linaclotide (LINZESS) 72 MCG capsule Take 72 mcg by mouth daily before breakfast.   Past Month   lovastatin (MEVACOR) 20 MG tablet Take 20 mg by mouth at bedtime.   06/30/2020   mesalamine (LIALDA) 1.2 g EC tablet Take 1.2 g by mouth 2 (two) times daily.   Past Month   methotrexate (RHEUMATREX) 2.5 MG tablet Take by mouth. Take 6 tablet by mouth every 7 days   Past Week   pantoprazole (PROTONIX) 40 MG tablet Take 40 mg by mouth daily.   Past Week   PARoxetine (PAXIL) 20 MG tablet Take 20 mg by mouth daily.   06/30/2020    rosuvastatin (CRESTOR) 5 MG tablet Take 5 mg by mouth daily.   06/30/2020   tiZANidine (ZANAFLEX) 4 MG tablet Take 4 mg by mouth 3 (three) times daily.   Past Week   Triamcinolone Acetonide (NASACORT AQ NA) Place 2 sprays into the nose 2 (two) times daily.   Past Week   estradiol (ESTRACE) 2 MG tablet Take 2 mg by mouth daily.      fluticasone (FLONASE) 50 MCG/ACT nasal spray Place 2 sprays into both nostrils daily for 15 days. 1 g 0    montelukast (SINGULAIR) 10 MG tablet Take 10 mg by mouth at bedtime.      promethazine-dextromethorphan (PROMETHAZINE-DM) 6.25-15 MG/5ML syrup Take 5 mLs by mouth 4 (four) times daily as needed for cough. 118 mL 0    sulfamethoxazole-trimethoprim (BACTRIM DS) 800-160 MG tablet Take 1 tablet by mouth 2 (two) times daily. (Patient not taking: Reported on 07/01/2020) 14 tablet 0 Completed Course     Allergies  Allergen Reactions   Salicylates     Other reaction(s): Other (See Comments) Facial edema, GI upset   Topiramate     Other reaction(s): Other (See Comments) Confusion   Tramadol Nausea And Vomiting and Nausea Only     Past Medical History:  Diagnosis Date   Asthma    Diverticulosis    Hemorrhoids 11/20/2015   internal and external   History of 2019 novel coronavirus disease (COVID-19) 12/25/2018  Hyperlipidemia    Ischemic colitis (HCC) 11/24/2015   Rheumatoid arthritis (HCC)    Surgical menopause     Review of systems:  Otherwise negative.    Physical Exam  Gen: Alert, oriented. Appears stated age.  HEENT: PERRLA Lungs: No respiratory distress CV: RRR Abd: soft, benign, no masses Ext: No edema    Planned procedures: Proceed with EGD/colonoscopy. The patient understands the nature of the planned procedure, indications, risks, alternatives and potential complications including but not limited to bleeding, infection, perforation, damage to internal organs and possible oversedation/side effects from anesthesia. The patient agrees and  gives consent to proceed.  Please refer to procedure notes for findings, recommendations and patient disposition/instructions.     Merlyn Lot MD, MPH Gastroenterology 07/01/2020  10:58 AM

## 2020-07-01 NOTE — Op Note (Signed)
[facility] Gastroenterology Patient Name: Nicole Turner Procedure Date: 07/01/2020 11:04 AM MRN: [id number] Account #: [id number] Date of Birth: 1955-09-11 Admit Type: Outpatient Age: 65 Room: [facility] ENDO ROOM 1 Gender: Female Note Status: Finalized Procedure:             Upper GI endoscopy Indications:           Gastro-esophageal reflux disease, Early satiety Providers:             Andrey Farmer MD, MD Referring MD:          Rusty Aus, MD (Referring MD) Medicines:             Monitored Anesthesia Care Complications:         No immediate complications. Procedure:             Pre-Anesthesia Assessment:                        - Prior to the procedure, a History and Physical was                         performed, and patient medications and allergies were                         reviewed. The patient is competent. The risks and                         benefits of the procedure and the sedation options and                         risks were discussed with the patient. All questions                         were answered and informed consent was obtained.                         Patient identification and proposed procedure were                         verified by the physician, the nurse, the anesthetist                         and the technician in the endoscopy suite. Mental                         Status Examination: alert and oriented. Airway                         Examination: normal oropharyngeal airway and neck                         mobility. Respiratory Examination: clear to                         auscultation. CV Examination: normal. Prophylactic                         Antibiotics: The patient does not require prophylactic  antibiotics. Prior Anticoagulants: The patient has                         taken Plavix (clopidogrel), last dose was 1 day prior                         to procedure. ASA Grade Assessment: III - A patient                          with severe systemic disease. After reviewing the                         risks and benefits, the patient was deemed in                         satisfactory condition to undergo the procedure. The                         anesthesia plan was to use monitored anesthesia care                         (MAC). Immediately prior to administration of                         medications, the patient was re-assessed for adequacy                         to receive sedatives. The heart rate, respiratory                         rate, oxygen saturations, blood pressure, adequacy of                         pulmonary ventilation, and response to care were                         monitored throughout the procedure. The physical                         status of the patient was re-assessed after the                         procedure.                        After obtaining informed consent, the endoscope was                         passed under direct vision. Throughout the procedure,                         the patient's blood pressure, pulse, and oxygen                         saturations were monitored continuously. The Endoscope                         was introduced through the mouth, and advanced to the  second part of duodenum. The upper GI endoscopy was                         accomplished without difficulty. The patient tolerated                         the procedure well. Findings:      A small hiatal hernia was present.      The exam of the esophagus was otherwise normal.      The entire examined stomach was normal.      The examined duodenum was normal. Impression:            - Small hiatal hernia.                        - Normal stomach.                        - Normal examined duodenum.                        - No specimens collected. Recommendation:        - Perform a colonoscopy today. Procedure Code(s):     --- Professional ---                         [phone], Esophagogastroduodenoscopy, flexible,                         transoral; diagnostic, including collection of                         specimen(s) by brushing or washing, when performed                         (separate procedure) Diagnosis Code(s):     --- Professional ---                        K44.9, Diaphragmatic hernia without obstruction or                         gangrene                        K21.9, Gastro-esophageal reflux disease without                         esophagitis                        R68.81, Early satiety CPT copyright 2019 [employer]. All rights reserved. The codes documented in this report are preliminary and upon coder review may  be revised to meet current compliance requirements. Andrey Farmer MD, MD 07/01/2020 11:32:28 AM Number of Addenda: 0 Note Initiated On: 07/01/2020 11:04 AM Estimated Blood Loss:  Estimated blood loss: none.      [facility]

## 2020-07-01 NOTE — Interval H&P Note (Signed)
**Note Nicole-Identified via Obfuscation** History and Physical Interval Note:  07/01/2020 11:03 AM  Nicole Turner  has presented today for surgery, with the diagnosis of GERD/constipation.  The various methods of treatment have been discussed with the patient and family. After consideration of risks, benefits and other options for treatment, the patient has consented to  Procedure(s) with comments: ESOPHAGOGASTRODUODENOSCOPY (EGD) WITH PROPOFOL (N/A) - COVID POSITIVE 04/14/2020 IN E.D. COLONOSCOPY WITH PROPOFOL (N/A) as a surgical intervention.  The patient's history has been reviewed, patient examined, no change in status, stable for surgery.  I have reviewed the patient's chart and labs.  Questions were answered to the patient's satisfaction.     Regis Bill  Ok to proceed with EGD/Colonoscopy

## 2020-07-02 ENCOUNTER — Encounter: Payer: Self-pay | Admitting: Gastroenterology

## 2021-07-02 ENCOUNTER — Other Ambulatory Visit: Payer: Self-pay | Admitting: Internal Medicine

## 2021-07-02 DIAGNOSIS — Z1231 Encounter for screening mammogram for malignant neoplasm of breast: Secondary | ICD-10-CM

## 2021-07-29 ENCOUNTER — Ambulatory Visit
Admission: RE | Admit: 2021-07-29 | Discharge: 2021-07-29 | Disposition: A | Discharge status: Home / Self Care | Payer: Medicare Other | Source: Ambulatory Visit | Attending: Internal Medicine | Admitting: Internal Medicine

## 2021-07-29 DIAGNOSIS — Z1231 Encounter for screening mammogram for malignant neoplasm of breast: Secondary | ICD-10-CM | POA: Diagnosis present

## 2021-08-21 ENCOUNTER — Ambulatory Visit: Payer: Medicare Other | Admitting: Anesthesiology

## 2021-08-21 ENCOUNTER — Ambulatory Visit
Admission: RE | Admit: 2021-08-21 | Discharge: 2021-08-21 | Disposition: A | Discharge status: Home / Self Care | Payer: Medicare Other | Attending: Gastroenterology | Admitting: Gastroenterology

## 2021-08-21 ENCOUNTER — Encounter: Payer: Self-pay | Admitting: Gastroenterology

## 2021-08-21 ENCOUNTER — Encounter
Admission: RE | Disposition: A | Discharge status: Home / Self Care | Payer: Self-pay | Source: Home / Self Care | Attending: Gastroenterology

## 2021-08-21 DIAGNOSIS — Z7902 Long term (current) use of antithrombotics/antiplatelets: Secondary | ICD-10-CM | POA: Insufficient documentation

## 2021-08-21 DIAGNOSIS — K529 Noninfective gastroenteritis and colitis, unspecified: Secondary | ICD-10-CM | POA: Insufficient documentation

## 2021-08-21 DIAGNOSIS — K6389 Other specified diseases of intestine: Secondary | ICD-10-CM | POA: Insufficient documentation

## 2021-08-21 DIAGNOSIS — R194 Change in bowel habit: Secondary | ICD-10-CM | POA: Diagnosis not present

## 2021-08-21 DIAGNOSIS — R1084 Generalized abdominal pain: Secondary | ICD-10-CM | POA: Diagnosis present

## 2021-08-21 DIAGNOSIS — K640 First degree hemorrhoids: Secondary | ICD-10-CM | POA: Diagnosis not present

## 2021-08-21 DIAGNOSIS — R1310 Dysphagia, unspecified: Secondary | ICD-10-CM | POA: Insufficient documentation

## 2021-08-21 DIAGNOSIS — K5730 Diverticulosis of large intestine without perforation or abscess without bleeding: Secondary | ICD-10-CM | POA: Insufficient documentation

## 2021-08-21 DIAGNOSIS — M069 Rheumatoid arthritis, unspecified: Secondary | ICD-10-CM | POA: Diagnosis not present

## 2021-08-21 DIAGNOSIS — I2510 Atherosclerotic heart disease of native coronary artery without angina pectoris: Secondary | ICD-10-CM | POA: Diagnosis not present

## 2021-08-21 HISTORY — PX: COLONOSCOPY WITH PROPOFOL: SHX5780

## 2021-08-21 HISTORY — PX: ESOPHAGOGASTRODUODENOSCOPY (EGD) WITH PROPOFOL: SHX5813

## 2021-08-21 SURGERY — COLONOSCOPY WITH PROPOFOL
Anesthesia: General

## 2021-08-21 MED ORDER — PROPOFOL 10 MG/ML IV BOLUS
INTRAVENOUS | Status: AC
  Filled 2021-08-21: qty 40

## 2021-08-21 MED ORDER — LIDOCAINE HCL (PF) 2 % IJ SOLN
INTRAMUSCULAR | Status: AC
  Filled 2021-08-21: qty 5

## 2021-08-21 MED ORDER — LIDOCAINE HCL (CARDIAC) PF 100 MG/5ML IV SOSY
PREFILLED_SYRINGE | INTRAVENOUS | Status: DC | PRN
  Administered 2021-08-21: 100 mg via INTRAVENOUS

## 2021-08-21 MED ORDER — PROPOFOL 10 MG/ML IV BOLUS
INTRAVENOUS | Status: DC | PRN
  Administered 2021-08-21: 120 ug/kg/min via INTRAVENOUS
  Administered 2021-08-21: 90 mg via INTRAVENOUS

## 2021-08-21 MED ORDER — SODIUM CHLORIDE 0.9 % IV SOLN
INTRAVENOUS | Status: DC
  Administered 2021-08-21: 20 mL/h via INTRAVENOUS

## 2021-08-21 NOTE — H&P (Signed)
Outpatient short stay form Pre-procedure 08/21/2021  Nicole Bill, MD  Primary Physician: Danella Penton, MD  Reason for visit:  Dyspepsia/Bowel habit changes  History of present illness:    66 y/o lady with history of constipation, possible UC, RA, and CAD on plavix with last dose 2 days ago here for EGD/Colonoscopy for dyspepsia and "mushy" stools. Explained to patient that we can take biopsies but can do large polypectomy due recent plavix use. Patient understands.    Current Facility-Administered Medications:    0.9 %  sodium chloride infusion, , Intravenous, Continuous, Alayssa Flinchum, Rossie Muskrat, MD, Last Rate: 20 mL/hr at 08/21/21 0913, 20 mL/hr at 08/21/21 0913  Medications Prior to Admission  Medication Sig Dispense Refill Last Dose   budesonide-formoterol (SYMBICORT) 80-4.5 MCG/ACT inhaler Inhale 2 puffs into the lungs 2 (two) times daily.   Past Week   clopidogrel (PLAVIX) 75 MG tablet Take 1 tablet (75 mg total) by mouth daily. 30 tablet 11 Past Week   estradiol (ESTRACE) 2 MG tablet Take 2 mg by mouth daily.   Past Week   fluticasone-salmeterol (ADVAIR) 250-50 MCG/ACT AEPB Inhale 1 puff into the lungs in the morning and at bedtime.   Past Week   furosemide (LASIX) 20 MG tablet Take 1 tablet by mouth daily as needed.   Past Week   hydroxychloroquine (PLAQUENIL) 200 MG tablet Alternate 400mg  one day and 200mg  the next day.   Past Week   linaclotide (LINZESS) 72 MCG capsule Take 72 mcg by mouth daily before breakfast.   Past Week   lovastatin (MEVACOR) 20 MG tablet Take 20 mg by mouth at bedtime.   Past Week   mesalamine (LIALDA) 1.2 g EC tablet Take 1.2 g by mouth 2 (two) times daily.   Past Week   methotrexate (RHEUMATREX) 2.5 MG tablet Take by mouth. Take 6 tablet by mouth every 7 days   Past Week   montelukast (SINGULAIR) 10 MG tablet Take 10 mg by mouth at bedtime.   Past Week   pantoprazole (PROTONIX) 40 MG tablet Take 40 mg by mouth daily.   Past Week   PARoxetine  (PAXIL) 20 MG tablet Take 20 mg by mouth daily.   Past Week   promethazine-dextromethorphan (PROMETHAZINE-DM) 6.25-15 MG/5ML syrup Take 5 mLs by mouth 4 (four) times daily as needed for cough. 118 mL 0 Past Week   rosuvastatin (CRESTOR) 5 MG tablet Take 5 mg by mouth daily.   Past Week   tiZANidine (ZANAFLEX) 4 MG tablet Take 4 mg by mouth 3 (three) times daily.   Past Week   Triamcinolone Acetonide (NASACORT AQ NA) Place 2 sprays into the nose 2 (two) times daily.   Past Week   fluticasone (FLONASE) 50 MCG/ACT nasal spray Place 2 sprays into both nostrils daily for 15 days. 1 g 0    sulfamethoxazole-trimethoprim (BACTRIM DS) 800-160 MG tablet Take 1 tablet by mouth 2 (two) times daily. (Patient not taking: Reported on 07/01/2020) 14 tablet 0      Allergies  Allergen Reactions   Salicylates Other (See Comments)    Other reaction(s): Other (See Comments) Facial edema, GI upset   Topiramate Other (See Comments)    Other reaction(s): Other (See Comments) Confusion   Tramadol Nausea And Vomiting and Nausea Only     Past Medical History:  Diagnosis Date   Asthma    Diverticulosis    Hemorrhoids 11/20/2015   internal and external   History of 2019 novel coronavirus disease (COVID-19) 12/25/2018  Hyperlipidemia    Ischemic colitis (HCC) 11/24/2015   Rheumatoid arthritis (HCC)    Surgical menopause     Review of systems:  Otherwise negative.    Physical Exam  Gen: Alert, oriented. Appears stated age.  HEENT: PERRLA. Lungs: No respiratory distress CV: RRR Abd: soft, benign, no masses Ext: No edema    Planned procedures: Proceed with EGD/colonoscopy. The patient understands the nature of the planned procedure, indications, risks, alternatives and potential complications including but not limited to bleeding, infection, perforation, damage to internal organs and possible oversedation/side effects from anesthesia. The patient agrees and gives consent to proceed.  Please refer to  procedure notes for findings, recommendations and patient disposition/instructions.     Nicole Bill, MD [facility] Gastroenterology

## 2021-08-21 NOTE — Anesthesia Postprocedure Evaluation (Signed)
Anesthesia Post Note  Patient: EMAAN GARY  Procedure(s) Performed: COLONOSCOPY WITH PROPOFOL ESOPHAGOGASTRODUODENOSCOPY (EGD) WITH PROPOFOL  Patient location during evaluation: Endoscopy Anesthesia Type: General Level of consciousness: awake and alert Pain management: pain level controlled Vital Signs Assessment: post-procedure vital signs reviewed and stable Respiratory status: spontaneous breathing, nonlabored ventilation, respiratory function stable and patient connected to nasal cannula oxygen Cardiovascular status: blood pressure returned to baseline and stable Postop Assessment: no apparent nausea or vomiting Anesthetic complications: no   No notable events documented.   Last Vitals:  Vitals:   08/21/21 1104 08/21/21 1114  BP:  (!) 151/59  Pulse:    Resp:    Temp:    SpO2: 99%     Last Pain:  Vitals:   08/21/21 1114  TempSrc:   PainSc: 0-No pain                 Cleda Mccreedy [facility]

## 2021-08-21 NOTE — Interval H&P Note (Signed)
History and Physical Interval Note:  08/21/2021 10:11 AM  De Blanch  has presented today for surgery, with the diagnosis of Bpwel Habit changes,generalized abdominal pain,esophagitis dysphagia.  The various methods of treatment have been discussed with the patient and family. After consideration of risks, benefits and other options for treatment, the patient has consented to  Procedure(s): COLONOSCOPY WITH PROPOFOL (N/A) ESOPHAGOGASTRODUODENOSCOPY (EGD) WITH PROPOFOL (N/A) as a surgical intervention.  The patient's history has been reviewed, patient examined, no change in status, stable for surgery.  I have reviewed the patient's chart and labs.  Questions were answered to the patient's satisfaction.     Regis Bill  Ok to proceed with EGD/Colonoscopy

## 2021-08-21 NOTE — OR Nursing (Signed)
Patient took Plavix on Wednesday, 08/19/21 and did not hold X 5 days per instructions.  Patient instructed that if she has procedures today, that she can not be dilated and large polyps cannot be removed.  Patient given option of rescheduling.  She prefers to have procedure done today understanding that she may have to return.

## 2021-08-21 NOTE — Anesthesia Preprocedure Evaluation (Signed)
Anesthesia Evaluation  Patient identified by MRN, date of birth, ID band Patient awake    Reviewed: Allergy & Precautions, NPO status , Patient's Chart, lab work & pertinent test results  History of Anesthesia Complications Negative for: history of anesthetic complications  Airway Mallampati: III  TM Distance: <3 FB Neck ROM: full    Dental  (+) Chipped   Pulmonary asthma ,    Pulmonary exam normal        Cardiovascular Exercise Tolerance: Good (-) anginanegative cardio ROS Normal cardiovascular exam     Neuro/Psych negative neurological ROS  negative psych ROS   GI/Hepatic negative GI ROS, Neg liver ROS, neg GERD  ,  Endo/Other  negative endocrine ROS  Renal/GU negative Renal ROS  negative genitourinary   Musculoskeletal   Abdominal   Peds  Hematology negative hematology ROS (+)   Anesthesia Other Findings Past Medical History: No date: Asthma No date: Diverticulosis 11/20/2015: Hemorrhoids     Comment:  internal and external 12/25/2018: History of 2019 novel coronavirus disease (COVID-19) No date: Hyperlipidemia 11/24/2015: Ischemic colitis (HCC) No date: Rheumatoid arthritis (HCC) 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  Past Surgical History: No date: COLONOSCOPY 11/20/2015: COLONOSCOPY WITH PROPOFOL; N/A     Comment:  Procedure: COLONOSCOPY WITH PROPOFOL;  Surgeon: Christena Deem, MD;  Location: [facility] ENDOSCOPY;  Service:               Endoscopy;  Laterality: N/A; 07/01/2020: COLONOSCOPY WITH PROPOFOL; N/A     Comment:  Procedure: COLONOSCOPY WITH PROPOFOL;  Surgeon:               Regis Bill, MD;  Location: [facility] ENDOSCOPY;                Service: Endoscopy;  Laterality: N/A; No date: ESOPHAGOGASTRODUODENOSCOPY 07/01/2020: ESOPHAGOGASTRODUODENOSCOPY (EGD) WITH PROPOFOL; N/A     Comment:  Procedure: ESOPHAGOGASTRODUODENOSCOPY (EGD) WITH               PROPOFOL;  Surgeon: Regis Bill, MD;  Location:               [facility] ENDOSCOPY;  Service: Endoscopy;  Laterality: N/A;                COVID POSITIVE 04/14/2020 IN E.D. 11/24/2015: PERIPHERAL VASCULAR CATHETERIZATION; N/A     Comment:  Procedure: Visceral Angiography;  Surgeon: Annice Needy,               MD;  Location: [facility] INVASIVE CV LAB;  Service:               Cardiovascular;  Laterality: N/A; No date: VAGINAL HYSTERECTOMY  BMI    Body Mass Index: 25.28 kg/m      Reproductive/Obstetrics negative OB ROS                             Anesthesia Physical Anesthesia Plan  ASA: 3  Anesthesia Plan: General   Post-op Pain Management:    Induction: Intravenous  PONV Risk Score and Plan: Propofol infusion and TIVA  Airway Management Planned: Natural Airway and Nasal Cannula  Additional Equipment:   Intra-op Plan:   Post-operative Plan:   Informed Consent: I have reviewed the patients History and Physical, chart, labs and discussed the procedure including the risks, benefits and alternatives for the proposed anesthesia with the patient or authorized representative who  has indicated his/her understanding and acceptance.     Dental Advisory Given  Plan Discussed with: Anesthesiologist, CRNA and Surgeon  Anesthesia Plan Comments: (Patient consented for risks of anesthesia including but not limited to:  - adverse reactions to medications - risk of airway placement if required - damage to eyes, teeth, lips or other oral mucosa - nerve damage due to positioning  - sore throat or hoarseness - Damage to heart, brain, nerves, lungs, other parts of body or loss of life  Patient voiced understanding.)        Anesthesia Quick Evaluation

## 2021-08-21 NOTE — Op Note (Signed)
[facility] Gastroenterology Patient Name: Nicole Turner Procedure Date: 08/21/2021 10:02 AM MRN: [id number] Account #: [id number] Date of Birth: 02-02-1955 Admit Type: Outpatient Age: 66 Room: [facility] ENDO ROOM 3 Gender: Female Note Status: Finalized Instrument Name: Upper Endoscope [id number] Procedure:             Upper GI endoscopy Indications:           Dyspepsia, Dysphagia Providers:             Andrey Farmer MD, MD Referring MD:          Rusty Aus, MD (Referring MD) Medicines:             Monitored Anesthesia Care Complications:         No immediate complications. Procedure:             Pre-Anesthesia Assessment:                        - Prior to the procedure, a History and Physical was                         performed, and patient medications and allergies were                         reviewed. The patient is competent. The risks and                         benefits of the procedure and the sedation options and                         risks were discussed with the patient. All questions                         were answered and informed consent was obtained.                         Patient identification and proposed procedure were                         verified by the physician, the nurse, the                         anesthesiologist, the anesthetist and the technician                         in the endoscopy suite. Mental Status Examination:                         alert and oriented. Airway Examination: normal                         oropharyngeal airway and neck mobility. Respiratory                         Examination: clear to auscultation. CV Examination:                         normal. Prophylactic Antibiotics: The patient does not  require prophylactic antibiotics. Prior                         Anticoagulants: The patient has taken Plavix                         (clopidogrel), last dose was 2 days prior to                          procedure. ASA Grade Assessment: II - A patient with                         mild systemic disease. After reviewing the risks and                         benefits, the patient was deemed in satisfactory                         condition to undergo the procedure. The anesthesia                         plan was to use monitored anesthesia care (MAC).                         Immediately prior to administration of medications,                         the patient was re-assessed for adequacy to receive                         sedatives. The heart rate, respiratory rate, oxygen                         saturations, blood pressure, adequacy of pulmonary                         ventilation, and response to care were monitored                         throughout the procedure. The physical status of the                         patient was re-assessed after the procedure.                        After obtaining informed consent, the endoscope was                         passed under direct vision. Throughout the procedure,                         the patient's blood pressure, pulse, and oxygen                         saturations were monitored continuously. The Endoscope                         was introduced through the mouth, and advanced to the  second part of duodenum. The upper GI endoscopy was                         accomplished without difficulty. The patient tolerated                         the procedure well. Findings:      The examined esophagus was normal.      The entire examined stomach was normal.      The examined duodenum was normal. Impression:            - Normal esophagus.                        - Normal stomach.                        - Normal examined duodenum.                        - No specimens collected. Recommendation:        - Perform a colonoscopy today. Procedure Code(s):     --- Professional ---                        [phone],  Esophagogastroduodenoscopy, flexible,                         transoral; diagnostic, including collection of                         specimen(s) by brushing or washing, when performed                         (separate procedure) Diagnosis Code(s):     --- Professional ---                        R10.13, Epigastric pain                        R13.10, Dysphagia, unspecified CPT copyright 2019 [employer] Medical Association. All rights reserved. The codes documented in this report are preliminary and upon coder review may  be revised to meet current compliance requirements. Andrey Farmer MD, MD 08/21/2021 10:48:24 AM Number of Addenda: 0 Note Initiated On: 08/21/2021 10:02 AM Estimated Blood Loss:  Estimated blood loss: none.      [facility]

## 2021-08-21 NOTE — Op Note (Signed)
[facility] Patient Name: Nicole Turner Procedure Date: 08/21/2021 10:02 AM MRN: [id number] Account #: [id number] Date of Birth: 1955/10/08 Admit Type: Outpatient Age: 66 Room: [facility] ENDO ROOM 3 Gender: Female Note Status: Finalized Instrument Name: Jasper Riling [id number] Procedure:             Colonoscopy Indications:           Generalized abdominal pain, Change in bowel habits Providers:             Andrey Farmer MD, MD Referring MD:          Megan Salon (Referring MD) Medicines:             Monitored Anesthesia Care Complications:         No immediate complications. Estimated blood loss:                         Minimal. Procedure:             Pre-Anesthesia Assessment:                        - Prior to the procedure, a History and Physical was                         performed, and patient medications and allergies were                         reviewed. The patient is competent. The risks and                         benefits of the procedure and the sedation options and                         risks were discussed with the patient. All questions                         were answered and informed consent was obtained.                         Patient identification and proposed procedure were                         verified by the physician, the nurse, the                         anesthesiologist, the anesthetist and the technician                         in the endoscopy suite. Mental Status Examination:                         alert and oriented. Airway Examination: normal                         oropharyngeal airway and neck mobility. Respiratory                         Examination: clear to auscultation. CV Examination:  normal. Prophylactic Antibiotics: The patient does not                         require prophylactic antibiotics. Prior                         Anticoagulants: The patient has taken Plavix                          (clopidogrel), last dose was 2 days prior to                         procedure. ASA Grade Assessment: II - A patient with                         mild systemic disease. After reviewing the risks and                         benefits, the patient was deemed in satisfactory                         condition to undergo the procedure. The anesthesia                         plan was to use monitored anesthesia care (MAC).                         Immediately prior to administration of medications,                         the patient was re-assessed for adequacy to receive                         sedatives. The heart rate, respiratory rate, oxygen                         saturations, blood pressure, adequacy of pulmonary                         ventilation, and response to care were monitored                         throughout the procedure. The physical status of the                         patient was re-assessed after the procedure.                        After obtaining informed consent, the colonoscope was                         passed under direct vision. Throughout the procedure,                         the patient's blood pressure, pulse, and oxygen                         saturations were monitored continuously. The  Colonoscope was introduced through the anus and                         advanced to the the terminal ileum. The colonoscopy                         was performed without difficulty. The patient                         tolerated the procedure well. The quality of the bowel                         preparation was good. Findings:      The perianal and digital rectal examinations were normal.      A localized area of mucosa in the terminal ileum was mildly       erythematous. Biopsies were taken with a cold forceps for histology.       Estimated blood loss was minimal.      The remainder of the exam in the terminal ileum was normal.       Normal mucosa was found in the entire colon. Biopsies for histology were       taken with a cold forceps from the entire colon for evaluation of       microscopic colitis. Estimated blood loss was minimal.      Multiple small-mouthed diverticula were found in the sigmoid colon and       descending colon.      Internal hemorrhoids were found during retroflexion. The hemorrhoids       were Grade I (internal hemorrhoids that do not prolapse).      The exam was otherwise without abnormality on direct and retroflexion       views. Impression:            - Erythematous mucosa in the terminal ileum. Biopsied.                        - Normal mucosa in the entire examined colon. Biopsied.                        - Diverticulosis in the sigmoid colon and in the                         descending colon.                        - Internal hemorrhoids.                        - The examination was otherwise normal on direct and                         retroflexion views. Recommendation:        - Discharge patient to home.                        - Resume previous diet.                        - Continue present medications.                        -  Await pathology results.                        - Repeat colonoscopy for surveillance based on                         pathology results.                        - Return to referring physician as previously                         scheduled. Procedure Code(s):     --- Professional ---                        [phone], Colonoscopy, flexible; with biopsy, single or                         multiple Diagnosis Code(s):     --- Professional ---                        K63.89, Other specified diseases of intestine                        K64.0, First degree hemorrhoids                        R10.84, Generalized abdominal pain                        R19.4, Change in bowel habit                        K57.30, Diverticulosis of large intestine without                          perforation or abscess without bleeding CPT copyright 2019 American Medical Association. All rights reserved. The codes documented in this report are preliminary and upon coder review may  be revised to meet current compliance requirements. Andrey Farmer MD, MD 08/21/2021 10:53:25 AM Number of Addenda: 0 Note Initiated On: 08/21/2021 10:02 AM Scope Withdrawal Time: 0 hours 9 minutes 12 seconds  Total Procedure Duration: 0 hours 16 minutes 34 seconds  Estimated Blood Loss:  Estimated blood loss was minimal.      [facility]

## 2021-08-21 NOTE — Transfer of Care (Signed)
Immediate Anesthesia Transfer of Care Note  Patient: Nicole Turner  Procedure(s) Performed: COLONOSCOPY WITH PROPOFOL ESOPHAGOGASTRODUODENOSCOPY (EGD) WITH PROPOFOL  Patient Location: PACU  Anesthesia Type:General  Level of Consciousness: awake  Airway & Oxygen Therapy: Patient Spontanous Breathing  Post-op Assessment: Report given to RN and Post -op Vital signs reviewed and stable  Post vital signs: Reviewed and stable  Last Vitals:  Vitals Value Taken Time  BP 119/45 08/21/21 1044  Temp 35.8 C 08/21/21 1044  Pulse 53 08/21/21 1047  Resp 14 08/21/21 1047  SpO2 100 % 08/21/21 1047  Vitals shown include unvalidated device data.  Last Pain:  Vitals:   08/21/21 1044  TempSrc: Temporal  PainSc: 0-No pain         Complications: No notable events documented.

## 2021-08-24 LAB — SURGICAL PATHOLOGY

## 2022-10-11 ENCOUNTER — Encounter: Payer: Self-pay | Admitting: Urology

## 2022-10-11 ENCOUNTER — Ambulatory Visit: Payer: Medicare Other | Admitting: Urology

## 2022-11-24 ENCOUNTER — Other Ambulatory Visit: Payer: Self-pay | Admitting: Gastroenterology

## 2022-11-24 DIAGNOSIS — K559 Vascular disorder of intestine, unspecified: Secondary | ICD-10-CM

## 2022-11-24 DIAGNOSIS — R1033 Periumbilical pain: Secondary | ICD-10-CM

## 2022-12-06 ENCOUNTER — Ambulatory Visit
Admission: RE | Admit: 2022-12-06 | Discharge: 2022-12-06 | Disposition: A | Discharge status: Home / Self Care | Payer: Medicare Other | Source: Ambulatory Visit | Attending: Gastroenterology | Admitting: Gastroenterology

## 2022-12-06 DIAGNOSIS — K559 Vascular disorder of intestine, unspecified: Secondary | ICD-10-CM | POA: Diagnosis present

## 2022-12-06 DIAGNOSIS — R1033 Periumbilical pain: Secondary | ICD-10-CM | POA: Diagnosis present

## 2022-12-06 MED ORDER — IOHEXOL 350 MG/ML SOLN
85.0000 mL | Freq: Once | INTRAVENOUS | Status: AC | PRN
  Administered 2022-12-06: 85 mL via INTRAVENOUS

## 2023-02-21 ENCOUNTER — Encounter
Admission: RE | Disposition: A | Discharge status: Home / Self Care | Payer: Self-pay | Source: Ambulatory Visit | Attending: Gastroenterology

## 2023-02-21 ENCOUNTER — Ambulatory Visit: Payer: Medicare Other | Admitting: Anesthesiology

## 2023-02-21 ENCOUNTER — Ambulatory Visit
Admission: RE | Admit: 2023-02-21 | Discharge: 2023-02-21 | Disposition: A | Discharge status: Home / Self Care | Payer: Medicare Other | Source: Ambulatory Visit | Attending: Gastroenterology | Admitting: Gastroenterology

## 2023-02-21 ENCOUNTER — Encounter: Payer: Self-pay | Admitting: Anesthesiology

## 2023-02-21 DIAGNOSIS — K31A0 Gastric intestinal metaplasia, unspecified: Secondary | ICD-10-CM | POA: Diagnosis not present

## 2023-02-21 DIAGNOSIS — K449 Diaphragmatic hernia without obstruction or gangrene: Secondary | ICD-10-CM | POA: Diagnosis not present

## 2023-02-21 DIAGNOSIS — J45909 Unspecified asthma, uncomplicated: Secondary | ICD-10-CM | POA: Insufficient documentation

## 2023-02-21 DIAGNOSIS — Z90710 Acquired absence of both cervix and uterus: Secondary | ICD-10-CM | POA: Insufficient documentation

## 2023-02-21 DIAGNOSIS — K2950 Unspecified chronic gastritis without bleeding: Secondary | ICD-10-CM | POA: Diagnosis not present

## 2023-02-21 DIAGNOSIS — R1310 Dysphagia, unspecified: Secondary | ICD-10-CM | POA: Diagnosis not present

## 2023-02-21 DIAGNOSIS — Z87891 Personal history of nicotine dependence: Secondary | ICD-10-CM | POA: Insufficient documentation

## 2023-02-21 DIAGNOSIS — Z7902 Long term (current) use of antithrombotics/antiplatelets: Secondary | ICD-10-CM | POA: Diagnosis not present

## 2023-02-21 DIAGNOSIS — R1013 Epigastric pain: Secondary | ICD-10-CM | POA: Diagnosis present

## 2023-02-21 HISTORY — DX: Diverticulitis of intestine, part unspecified, without perforation or abscess without bleeding: K57.92

## 2023-02-21 HISTORY — PX: ESOPHAGOGASTRODUODENOSCOPY (EGD) WITH PROPOFOL: SHX5813

## 2023-02-21 HISTORY — PX: BIOPSY: SHX5522

## 2023-02-21 SURGERY — ESOPHAGOGASTRODUODENOSCOPY (EGD) WITH PROPOFOL
Anesthesia: General

## 2023-02-21 MED ORDER — LIDOCAINE HCL (PF) 2 % IJ SOLN
INTRAMUSCULAR | Status: AC
Start: 2023-02-21 — End: ?
  Filled 2023-02-21: qty 5

## 2023-02-21 MED ORDER — PROPOFOL 10 MG/ML IV BOLUS
INTRAVENOUS | Status: DC | PRN
  Administered 2023-02-21: 50 mg via INTRAVENOUS

## 2023-02-21 MED ORDER — LIDOCAINE HCL (CARDIAC) PF 100 MG/5ML IV SOSY
PREFILLED_SYRINGE | INTRAVENOUS | Status: DC | PRN
  Administered 2023-02-21: 40 mg via INTRAVENOUS

## 2023-02-21 MED ORDER — PROPOFOL 500 MG/50ML IV EMUL
INTRAVENOUS | Status: DC | PRN
  Administered 2023-02-21: 120 ug/kg/min via INTRAVENOUS

## 2023-02-21 MED ORDER — SODIUM CHLORIDE 0.9 % IV SOLN: INTRAVENOUS | Status: DC

## 2023-02-21 MED ORDER — PROPOFOL 1000 MG/100ML IV EMUL
INTRAVENOUS | Status: AC
Start: 2023-02-21 — End: ?
  Filled 2023-02-21: qty 100

## 2023-02-21 NOTE — Anesthesia Preprocedure Evaluation (Signed)
 Anesthesia Evaluation  Patient identified by MRN, date of birth, ID band Patient awake    Reviewed: Allergy & Precautions, NPO status , Patient's Chart, lab work & pertinent test results  History of Anesthesia Complications Negative for: history of anesthetic complications  Airway Mallampati: II  TM Distance: >3 FB Neck ROM: full    Dental no notable dental hx.    Pulmonary asthma , former smoker   Pulmonary exam normal        Cardiovascular negative cardio ROS Normal cardiovascular exam     Neuro/Psych negative neurological ROS  negative psych ROS   GI/Hepatic negative GI ROS, Neg liver ROS,,,  Endo/Other  negative endocrine ROS    Renal/GU negative Renal ROS  negative genitourinary   Musculoskeletal  (+) Arthritis , Rheumatoid disorders,    Abdominal   Peds  Hematology negative hematology ROS (+)   Anesthesia Other Findings Past Medical History: No date: Asthma No date: Diverticulitis No date: Diverticulosis 11/20/2015: Hemorrhoids     Comment:  internal and external 12/25/2018: History of 2019 novel coronavirus disease (COVID-19) No date: Hyperlipidemia 11/24/2015: Ischemic colitis (HCC) No date: Rheumatoid arthritis (HCC) 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  Past Surgical History: No date: CARDIAC CATHETERIZATION No date: COLONOSCOPY 11/20/2015: COLONOSCOPY WITH PROPOFOL; N/A     Comment:  Procedure: COLONOSCOPY WITH PROPOFOL;  Surgeon: Christena Deem, MD;  Location: [facility] ENDOSCOPY;  Service:               Endoscopy;  Laterality: N/A; 07/01/2020: COLONOSCOPY WITH PROPOFOL; N/A     Comment:  Procedure: COLONOSCOPY WITH PROPOFOL;  Surgeon:               Regis Bill, MD;  Location: [facility] ENDOSCOPY;                Service: Endoscopy;  Laterality: N/A; 08/21/2021: COLONOSCOPY WITH PROPOFOL; N/A     Comment:  Procedure: COLONOSCOPY WITH PROPOFOL;  Surgeon:               Regis Bill, MD;  Location: [facility] ENDOSCOPY;                Service: Endoscopy;  Laterality: N/A; No date: ESOPHAGOGASTRODUODENOSCOPY 07/01/2020: ESOPHAGOGASTRODUODENOSCOPY (EGD) WITH PROPOFOL; N/A     Comment:  Procedure: ESOPHAGOGASTRODUODENOSCOPY (EGD) WITH               PROPOFOL;  Surgeon: Regis Bill, MD;  Location:               [facility] ENDOSCOPY;  Service: Endoscopy;  Laterality: N/A;                COVID POSITIVE 04/14/2020 IN E.D. 08/21/2021: ESOPHAGOGASTRODUODENOSCOPY (EGD) WITH PROPOFOL; N/A     Comment:  Procedure: ESOPHAGOGASTRODUODENOSCOPY (EGD) WITH               PROPOFOL;  Surgeon: Regis Bill, MD;  Location:               [facility] ENDOSCOPY;  Service: Endoscopy;  Laterality: N/A; 11/24/2015: PERIPHERAL VASCULAR CATHETERIZATION; N/A     Comment:  Procedure: Visceral Angiography;  Surgeon: Annice Needy,               MD;  Location: [facility] INVASIVE CV LAB;  Service:               Cardiovascular;  Laterality: N/A; No date: VAGINAL HYSTERECTOMY  BMI  Body Mass Index: 24.69 kg/m      Reproductive/Obstetrics negative OB ROS                              Anesthesia Physical Anesthesia Plan  ASA: 3  Anesthesia Plan: General   Post-op Pain Management: Minimal or no pain anticipated   Induction: Intravenous  PONV Risk Score and Plan: 2 and Propofol infusion and TIVA  Airway Management Planned: Natural Airway and Nasal Cannula  Additional Equipment:   Intra-op Plan:   Post-operative Plan:   Informed Consent: I have reviewed the patients History and Physical, chart, labs and discussed the procedure including the risks, benefits and alternatives for the proposed anesthesia with the patient or authorized representative who has indicated his/her understanding and acceptance.     Dental Advisory Given  Plan Discussed with: Anesthesiologist, CRNA and Surgeon  Anesthesia Plan Comments: (Patient consented for risks of anesthesia  including but not limited to:  - adverse reactions to medications - risk of airway placement if required - damage to eyes, teeth, lips or other oral mucosa - nerve damage due to positioning  - sore throat or hoarseness - Damage to heart, brain, nerves, lungs, other parts of body or loss of life  Patient voiced understanding and assent.)         Anesthesia Quick Evaluation

## 2023-02-21 NOTE — Transfer of Care (Signed)
 Immediate Anesthesia Transfer of Care Note  Patient: Nicole Turner  Procedure(s) Performed: ESOPHAGOGASTRODUODENOSCOPY (EGD) WITH PROPOFOL  Patient Location: PACU  Anesthesia Type:General  Level of Consciousness: awake and drowsy  Airway & Oxygen Therapy: Patient Spontanous Breathing and Patient connected to nasal cannula oxygen  Post-op Assessment: Report given to RN and Post -op Vital signs reviewed and stable  Post vital signs: Reviewed and stable  Last Vitals:  Vitals Value Taken Time  BP    Temp    Pulse    Resp    SpO2      Last Pain:  Vitals:   02/21/23 1046  TempSrc: Temporal         Complications: There were no known notable events for this encounter.

## 2023-02-21 NOTE — Anesthesia Postprocedure Evaluation (Signed)
 Anesthesia Post Note  Patient: WENONAH MILO  Procedure(s) Performed: ESOPHAGOGASTRODUODENOSCOPY (EGD) WITH PROPOFOL BIOPSY  Patient location during evaluation: Endoscopy Anesthesia Type: General Level of consciousness: awake and alert Pain management: pain level controlled Vital Signs Assessment: post-procedure vital signs reviewed and stable Respiratory status: spontaneous breathing, nonlabored ventilation, respiratory function stable and patient connected to nasal cannula oxygen Cardiovascular status: blood pressure returned to baseline and stable Postop Assessment: no apparent nausea or vomiting Anesthetic complications: no   There were no known notable events for this encounter.   Last Vitals:  Vitals:   02/21/23 1046 02/21/23 1130  BP: (!) 132/52 (!) 104/45  Pulse: (!) 52 (!) 51  Resp: 16 (!) 49  Temp: (!) 36 C 36.8 C  SpO2: 100% 95%    Last Pain:  Vitals:   02/21/23 1130  TempSrc: Temporal  PainSc: Asleep                 Louie Boston

## 2023-02-21 NOTE — Interval H&P Note (Signed)
**Note Nicole-Identified via Obfuscation**  History and Physical Interval Note:  02/21/2023 11:17 AM  Nicole Turner  has presented today for surgery, with the diagnosis of dyspsia,pharngoesophageal dysphagia.  The various methods of treatment have been discussed with the patient and family. After consideration of risks, benefits and other options for treatment, the patient has consented to  Procedure(s): ESOPHAGOGASTRODUODENOSCOPY (EGD) WITH PROPOFOL (N/A) as a surgical intervention.  The patient's history has been reviewed, patient examined, no change in status, stable for surgery.  I have reviewed the patient's chart and labs.  Questions were answered to the patient's satisfaction.     Regis Bill  Ok to proceed with EGD

## 2023-02-21 NOTE — Op Note (Signed)
 [facility] Patient Name: Nicole Turner Procedure Date: 02/21/2023 11:07 AM MRN: [id number] Account #: [id number] Date of Birth: July 30, 1955 Admit Type: Outpatient Age: 68 Room: [facility] ENDO ROOM 3 Gender: Female Note Status: Finalized Instrument Name: Patton Salles Endoscope [id number] Procedure:             Upper GI endoscopy Indications:           Epigastric abdominal pain, Dyspepsia, Dysphagia,                         Odynophagia Providers:             Eather Colas MD, MD Medicines:             Monitored Anesthesia Care Complications:         No immediate complications. Estimated blood loss:                         Minimal. Procedure:             Pre-Anesthesia Assessment:                        - Prior to the procedure, a History and Physical was                         performed, and patient medications and allergies were                         reviewed. The patient is competent. The risks and                         benefits of the procedure and the sedation options and                         risks were discussed with the patient. All questions                         were answered and informed consent was obtained.                         Patient identification and proposed procedure were                         verified by the physician, the nurse, the                         anesthesiologist, the anesthetist and the technician                         in the endoscopy suite. Mental Status Examination:                         alert and oriented. Airway Examination: normal                         oropharyngeal airway and neck mobility. Respiratory                         Examination: clear to auscultation. CV Examination:  normal. Prophylactic Antibiotics: The patient does not                         require prophylactic antibiotics. Prior                         Anticoagulants: The patient has taken Plavix                          (clopidogrel), last dose was 5 days prior to                         procedure. ASA Grade Assessment: III - A patient with                         severe systemic disease. After reviewing the risks and                         benefits, the patient was deemed in satisfactory                         condition to undergo the procedure. The anesthesia                         plan was to use monitored anesthesia care (MAC).                         Immediately prior to administration of medications,                         the patient was re-assessed for adequacy to receive                         sedatives. The heart rate, respiratory rate, oxygen                         saturations, blood pressure, adequacy of pulmonary                         ventilation, and response to care were monitored                         throughout the procedure. The physical status of the                         patient was re-assessed after the procedure.                        After obtaining informed consent, the endoscope was                         passed under direct vision. Throughout the procedure,                         the patient's blood pressure, pulse, and oxygen                         saturations were monitored continuously. The Endoscope  was introduced through the mouth, and advanced to the                         second part of duodenum. The upper GI endoscopy was                         accomplished without difficulty. The patient tolerated                         the procedure well. Findings:      A small hiatal hernia was present.      The exam of the esophagus was otherwise normal.      Biopsies were obtained from the proximal and distal esophagus with cold       forceps for histology of suspected eosinophilic esophagitis. Estimated       blood loss was minimal.      The entire examined stomach was normal. Biopsies were taken with a cold       forceps for  Helicobacter pylori testing. Estimated blood loss was       minimal.      The examined duodenum was normal. Impression:            - Small hiatal hernia.                        - Normal stomach. Biopsied.                        - Normal examined duodenum.                        - Biopsies were taken with a cold forceps for                         evaluation of eosinophilic esophagitis. Recommendation:        - Discharge patient to home.                        - Resume previous diet.                        - Continue present medications.                        - Await pathology results.                        - Return to referring physician as previously                         scheduled. Procedure Code(s):     --- Professional ---                        [phone], Esophagogastroduodenoscopy, flexible,                         transoral; with biopsy, single or multiple Diagnosis Code(s):     --- Professional ---                        K44.9, Diaphragmatic hernia without obstruction or  gangrene                        R10.13, Epigastric pain                        R13.10, Dysphagia, unspecified CPT copyright 2022 American Medical Association. All rights reserved. The codes documented in this report are preliminary and upon coder review may  be revised to meet current compliance requirements. Eather Colas MD, MD 02/21/2023 11:32:01 AM Number of Addenda: 0 Note Initiated On: 02/21/2023 11:07 AM Estimated Blood Loss:  Estimated blood loss was minimal.      [facility]

## 2023-02-21 NOTE — H&P (Signed)
 Outpatient short stay form Pre-procedure 02/21/2023  Nicole Bill, MD  Primary Physician: Danella Penton, MD  Reason for visit:  Abdominal pain/Odynophagia  History of present illness:    68 y/o lady with history of RA, ischemic colitis, and HLD here for EGD for abdominal pain and odynophagia. Last took plavix 5 days ago. History of hysterectomy. No family history of GI malignancies.    Current Facility-Administered Medications:    0.9 %  sodium chloride infusion, , Intravenous, Continuous, Rykin Route, Rossie Muskrat, MD, Last Rate: 20 mL/hr at 02/21/23 1101, New Bag at 02/21/23 1101  Medications Prior to Admission  Medication Sig Dispense Refill Last Dose/Taking   adalimumab (HUMIRA, 2 SYRINGE,) 40 MG/0.4ML prefilled syringe Inject into the skin.   Past Week   budesonide-formoterol (SYMBICORT) 80-4.5 MCG/ACT inhaler Inhale 2 puffs into the lungs 2 (two) times daily.   Past Month   citalopram (CELEXA) 20 MG tablet Take 20 mg by mouth daily.   02/20/2023   furosemide (LASIX) 20 MG tablet Take 1 tablet by mouth daily as needed.   Past Month   hydroxychloroquine (PLAQUENIL) 200 MG tablet Alternate 400mg  one day and 200mg  the next day.   02/20/2023   linaclotide (LINZESS) 72 MCG capsule Take 72 mcg by mouth daily before breakfast.   Past Month   lovastatin (MEVACOR) 20 MG tablet Take 20 mg by mouth at bedtime.   02/20/2023   mesalamine (LIALDA) 1.2 g EC tablet Take 1.2 g by mouth 2 (two) times daily.   02/20/2023   methotrexate (RHEUMATREX) 2.5 MG tablet Take by mouth. Take 6 tablet by mouth every 7 days   02/20/2023   pantoprazole (PROTONIX) 40 MG tablet Take 40 mg by mouth daily.   Past Week   clopidogrel (PLAVIX) 75 MG tablet Take 1 tablet (75 mg total) by mouth daily. 30 tablet 11 02/15/2023   estradiol (ESTRACE) 2 MG tablet Take 2 mg by mouth daily.      fluticasone (FLONASE) 50 MCG/ACT nasal spray Place 2 sprays into both nostrils daily for 15 days. 1 g 0    fluticasone-salmeterol (ADVAIR)  250-50 MCG/ACT AEPB Inhale 1 puff into the lungs in the morning and at bedtime.      montelukast (SINGULAIR) 10 MG tablet Take 10 mg by mouth at bedtime.      PARoxetine (PAXIL) 20 MG tablet Take 20 mg by mouth daily.      promethazine-dextromethorphan (PROMETHAZINE-DM) 6.25-15 MG/5ML syrup Take 5 mLs by mouth 4 (four) times daily as needed for cough. 118 mL 0    rosuvastatin (CRESTOR) 5 MG tablet Take 5 mg by mouth daily.      sulfamethoxazole-trimethoprim (BACTRIM DS) 800-160 MG tablet Take 1 tablet by mouth 2 (two) times daily. (Patient not taking: Reported on 07/01/2020) 14 tablet 0    tiZANidine (ZANAFLEX) 4 MG tablet Take 4 mg by mouth 3 (three) times daily. (Patient not taking: Reported on 02/21/2023)   Not Taking   Triamcinolone Acetonide (NASACORT AQ NA) Place 2 sprays into the nose 2 (two) times daily.        Allergies  Allergen Reactions   Salicylates Other (See Comments)    Other reaction(s): Other (See Comments) Facial edema, GI upset   Topiramate Other (See Comments)    Other reaction(s): Other (See Comments) Confusion   Tramadol Nausea And Vomiting and Nausea Only     Past Medical History:  Diagnosis Date   Asthma    Diverticulitis    Diverticulosis  Hemorrhoids 11/20/2015   internal and external   History of 2019 novel coronavirus disease (COVID-19) 12/25/2018   Hyperlipidemia    Ischemic colitis (HCC) 11/24/2015   Rheumatoid arthritis (HCC)    Surgical menopause     Review of systems:  Otherwise negative.    Physical Exam  Gen: Alert, oriented. Appears stated age.  HEENT: PERRLA. Lungs: No respiratory distress CV: RRR Abd: soft, benign, no masses Ext: No edema    Planned procedures: Proceed with EGD. The patient understands the nature of the planned procedure, indications, risks, alternatives and potential complications including but not limited to bleeding, infection, perforation, damage to internal organs and possible oversedation/side effects  from anesthesia. The patient agrees and gives consent to proceed.  Please refer to procedure notes for findings, recommendations and patient disposition/instructions.     Nicole Bill, MD [facility] Gastroenterology

## 2023-02-22 ENCOUNTER — Encounter: Payer: Self-pay | Admitting: Gastroenterology

## 2023-02-22 LAB — SURGICAL PATHOLOGY

## 2023-11-23 ENCOUNTER — Other Ambulatory Visit: Payer: Self-pay | Admitting: Podiatry

## 2023-11-28 ENCOUNTER — Encounter
Admission: RE | Admit: 2023-11-28 | Discharge: 2023-11-28 | Disposition: A | Discharge status: Home / Self Care | Source: Ambulatory Visit | Attending: Podiatry | Admitting: Podiatry

## 2023-11-28 ENCOUNTER — Other Ambulatory Visit: Payer: Self-pay

## 2023-11-28 VITALS — Ht 61.5 in | Wt 127.0 lb

## 2023-11-28 DIAGNOSIS — E785 Hyperlipidemia, unspecified: Secondary | ICD-10-CM

## 2023-11-28 DIAGNOSIS — Z01810 Encounter for preprocedural cardiovascular examination: Secondary | ICD-10-CM

## 2023-11-28 HISTORY — DX: Headache, unspecified: R51.9

## 2023-11-28 HISTORY — DX: Anxiety disorder, unspecified: F41.9

## 2023-11-28 HISTORY — DX: Ulcerative colitis, unspecified, without complications: K51.90

## 2023-11-28 HISTORY — DX: Major depressive disorder, recurrent, mild: F33.0

## 2023-11-28 HISTORY — DX: Other specified postprocedural states: Z98.890

## 2023-11-28 HISTORY — DX: Gastro-esophageal reflux disease without esophagitis: K21.9

## 2023-11-28 NOTE — Patient Instructions (Addendum)
 Your procedure is scheduled on: Friday 12/09/23  Report to the Registration Desk on the 1st floor of the [facility]. To find out your arrival time, please call [phone] between 1PM - 3PM on: Thursday 12/08/23 If your arrival time is 6:00 am, do not arrive before that time as the [facility] entrance doors do not open until 6:00 am.  REMEMBER: Instructions that are not followed completely may result in serious medical risk, up to and including death; or upon the discretion of your surgeon and anesthesiologist your surgery may need to be rescheduled.  Do not eat food after midnight the night before surgery.  No gum chewing or hard candies.  You may however, drink CLEAR liquids up to 2 hours before you are scheduled to arrive for your surgery. Do not drink anything within 2 hours of your scheduled arrival time.  Clear liquids include: - water  - apple juice without pulp - black coffee or tea (Do NOT add milk or creamers to the coffee or tea) Do NOT drink anything that is not on this list.   In addition, your doctor has ordered for you to drink the provided:  Ensure Pre-Surgery Clear Carbohydrate Drink  Drinking this carbohydrate drink up to two hours before surgery helps to reduce insulin resistance and improve patient outcomes. Please complete drinking 2 hours before scheduled arrival time.  One week prior to surgery: Stop Anti-inflammatories (NSAIDS) such as Advil, Aleve, Ibuprofen, Motrin, Naproxen, Naprosyn and Aspirin  based products such as Excedrin, Goody's Powder, BC Powder. Stop ANY OVER THE COUNTER supplements until after surgery.  You may however, continue to take Tylenol  if needed for pain up until the day of surgery.  Stop clopidogrel  (PLAVIX ) 75 MG 5 days prior to surgery. Stop methotrexate (RHEUMATREX) 2.5 MG 1-2 weeks as instructed by your surgeon.   Continue taking all of your other prescription medications up until the day of surgery.  ON THE DAY OF SURGERY  ONLY TAKE THESE MEDICATIONS WITH SIPS OF WATER:  citalopram (CELEXA) 20 MG  omeprazole (PRILOSEC) 40 MG  rosuvastatin (CRESTOR) 5 MG    Use inhalers on the day of surgery and bring to the hospital. budesonide-formoterol (SYMBICORT) 80-4.5 MCG/ACT inhaler  fluticasone -salmeterol (ADVAIR) 250-50 MCG/ACT AEPB   No Alcohol for 24 hours before or after surgery.  No Smoking including e-cigarettes for 24 hours before surgery.  No chewable tobacco products for at least 6 hours before surgery.  No nicotine patches on the day of surgery.  Do not use any recreational drugs for at least a week (preferably 2 weeks) before your surgery.  Please be advised that the combination of cocaine and anesthesia may have negative outcomes, up to and including death. If you test positive for cocaine, your surgery will be cancelled.  On the morning of surgery brush your teeth with toothpaste and water, you may rinse your mouth with mouthwash if you wish. Do not swallow any toothpaste or mouthwash.  Use CHG Soap or wipes as directed on instruction sheet.  Do not wear jewelry, make-up, hairpins, clips or nail polish.  For welded (permanent) jewelry: bracelets, anklets, waist bands, etc.  Please have this removed prior to surgery.  If it is not removed, there is a chance that hospital personnel will need to cut it off on the day of surgery.  Do not wear lotions, powders, or perfumes.   Do not shave body hair from the neck down 48 hours before surgery.  Contact lenses, hearing aids and dentures  may not be worn into surgery.  Do not bring valuables to the hospital. [facility] is not responsible for any missing/lost belongings or valuables.   Notify your doctor if there is any change in your medical condition (cold, fever, infection).  Wear comfortable clothing (specific to your surgery type) to the hospital.  After surgery, you can help prevent lung complications by doing breathing exercises.  Take  deep breaths and cough every 1-2 hours. Your doctor may order a device called an Incentive Spirometer to help you take deep breaths. When coughing or sneezing, hold a pillow firmly against your incision with both hands. This is called "splinting." Doing this helps protect your incision. It also decreases belly discomfort.  If you are being admitted to the hospital overnight, leave your suitcase in the car. After surgery it may be brought to your room.  In case of increased patient census, it may be necessary for you, the patient, to continue your postoperative care in the Same Day Surgery department.  If you are being discharged the day of surgery, you will not be allowed to drive home. You will need a responsible individual to drive you home and stay with you for 24 hours after surgery.   If you are taking public transportation, you will need to have a responsible individual with you.  Please call the Pre-admissions Testing Dept. at [phone] if you have any questions about these instructions.  Surgery Visitation Policy:  Patients having surgery or a procedure may have two visitors.  Children under the age of 67 must have an adult with them who is not the patient.  Inpatient Visitation:    Visiting hours are 7 a.m. to 8 p.m. Up to four visitors are allowed at one time in a patient room. The visitors may rotate out with other people during the day.  One visitor age 20 or older may stay with the patient overnight and must be in the room by 8 p.m.   Merchandiser, Retail to address health-related social needs:  [email or web address]                                                                                                             Preparing for Surgery with CHLORHEXIDINE GLUCONATE (CHG) Soap  Chlorhexidine Gluconate (CHG) Soap  o An antiseptic cleaner that kills germs and bonds with the skin to continue killing germs even after washing  o Used for  showering the night before surgery and morning of surgery  Before surgery, you can play an important role by reducing the number of germs on your skin.  CHG (Chlorhexidine gluconate) soap is an antiseptic cleanser which kills germs and bonds with the skin to continue killing germs even after washing.  Please do not use if you have an allergy to CHG or antibacterial soaps. If your skin becomes reddened/irritated stop using the CHG.  1. Shower the NIGHT BEFORE SURGERY with CHG soap.  2. If you choose to wash your hair, wash your hair first as usual with your normal  shampoo.  3. After shampooing, rinse your hair and body thoroughly to remove the shampoo.  4. Use CHG as you would any other liquid soap. You can apply CHG directly to the skin and wash gently with a clean washcloth.  5. Apply the CHG soap to your body only from the neck down. Do not use on open wounds or open sores. Avoid contact with your eyes, ears, mouth, and genitals (private parts). Wash face and genitals (private parts) with your normal soap.  6. Wash thoroughly, paying special attention to the area where your surgery will be performed.  7. Thoroughly rinse your body with warm water.  8. Do not shower/wash with your normal soap after using and rinsing off the CHG soap.  9. Do not use lotions, oils, etc., after showering with CHG.  10. Pat yourself dry with a clean towel.  11. Wear clean pajamas to bed the night before surgery.  12. Place clean sheets on your bed the night of your shower and do not sleep with pets.  13. Do not apply any deodorants/lotions/powders.  14. Please wear clean clothes to the hospital.  15. Remember to brush your teeth with your regular toothpaste.

## 2023-11-29 ENCOUNTER — Other Ambulatory Visit: Payer: Self-pay | Admitting: Internal Medicine

## 2023-11-29 ENCOUNTER — Encounter
Admission: RE | Admit: 2023-11-29 | Discharge: 2023-11-29 | Disposition: A | Discharge status: Home / Self Care | Source: Ambulatory Visit | Attending: Podiatry | Admitting: Podiatry

## 2023-11-29 DIAGNOSIS — Z01818 Encounter for other preprocedural examination: Secondary | ICD-10-CM | POA: Diagnosis present

## 2023-11-29 DIAGNOSIS — Z01810 Encounter for preprocedural cardiovascular examination: Secondary | ICD-10-CM

## 2023-11-29 DIAGNOSIS — E785 Hyperlipidemia, unspecified: Secondary | ICD-10-CM | POA: Insufficient documentation

## 2023-11-29 DIAGNOSIS — Z1231 Encounter for screening mammogram for malignant neoplasm of breast: Secondary | ICD-10-CM

## 2023-12-09 ENCOUNTER — Ambulatory Visit

## 2023-12-09 ENCOUNTER — Other Ambulatory Visit: Payer: Self-pay

## 2023-12-09 ENCOUNTER — Encounter: Admitting: Anesthesiology

## 2023-12-09 ENCOUNTER — Ambulatory Visit: Admission: RE | Admit: 2023-12-09 | Discharge: 2023-12-09 | Disposition: A | Attending: Podiatry | Admitting: Podiatry

## 2023-12-09 ENCOUNTER — Encounter: Payer: Self-pay | Admitting: Podiatry

## 2023-12-09 ENCOUNTER — Encounter: Admission: RE | Disposition: A | Payer: Self-pay | Source: Home / Self Care | Attending: Podiatry

## 2023-12-09 ENCOUNTER — Ambulatory Visit: Admitting: Anesthesiology

## 2023-12-09 DIAGNOSIS — K5190 Ulcerative colitis, unspecified, without complications: Secondary | ICD-10-CM | POA: Insufficient documentation

## 2023-12-09 DIAGNOSIS — Z79899 Other long term (current) drug therapy: Secondary | ICD-10-CM | POA: Insufficient documentation

## 2023-12-09 DIAGNOSIS — Z7902 Long term (current) use of antithrombotics/antiplatelets: Secondary | ICD-10-CM | POA: Insufficient documentation

## 2023-12-09 DIAGNOSIS — M069 Rheumatoid arthritis, unspecified: Secondary | ICD-10-CM | POA: Insufficient documentation

## 2023-12-09 DIAGNOSIS — M216X2 Other acquired deformities of left foot: Secondary | ICD-10-CM | POA: Diagnosis present

## 2023-12-09 DIAGNOSIS — Z87891 Personal history of nicotine dependence: Secondary | ICD-10-CM | POA: Diagnosis not present

## 2023-12-09 DIAGNOSIS — M19072 Primary osteoarthritis, left ankle and foot: Secondary | ICD-10-CM | POA: Diagnosis present

## 2023-12-09 DIAGNOSIS — F32A Depression, unspecified: Secondary | ICD-10-CM | POA: Insufficient documentation

## 2023-12-09 DIAGNOSIS — M205X2 Other deformities of toe(s) (acquired), left foot: Secondary | ICD-10-CM | POA: Insufficient documentation

## 2023-12-09 DIAGNOSIS — J45909 Unspecified asthma, uncomplicated: Secondary | ICD-10-CM | POA: Diagnosis not present

## 2023-12-09 DIAGNOSIS — Z7951 Long term (current) use of inhaled steroids: Secondary | ICD-10-CM | POA: Insufficient documentation

## 2023-12-09 DIAGNOSIS — L84 Corns and callosities: Secondary | ICD-10-CM | POA: Diagnosis present

## 2023-12-09 DIAGNOSIS — Z794 Long term (current) use of insulin: Secondary | ICD-10-CM | POA: Insufficient documentation

## 2023-12-09 HISTORY — PX: METATARSAL HEAD EXCISION: SHX5027

## 2023-12-09 SURGERY — EXCISION, METATARSAL BONE, HEAD
Anesthesia: General | Site: Fifth Toe | Laterality: Left

## 2023-12-09 MED ORDER — MIDAZOLAM HCL 2 MG/2ML IJ SOLN
INTRAMUSCULAR | Status: AC
  Filled 2023-12-09: qty 2

## 2023-12-09 MED ORDER — CHLORHEXIDINE GLUCONATE 0.12 % MT SOLN
OROMUCOSAL | Status: AC
  Filled 2023-12-09: qty 15

## 2023-12-09 MED ORDER — FENTANYL CITRATE (PF) 100 MCG/2ML IJ SOLN: 25.0000 ug | INTRAMUSCULAR | Status: DC | PRN

## 2023-12-09 MED ORDER — EPHEDRINE SULFATE (PRESSORS) 25 MG/5ML IV SOSY
PREFILLED_SYRINGE | INTRAVENOUS | Status: DC | PRN
  Administered 2023-12-09: 10 mg via INTRAVENOUS

## 2023-12-09 MED ORDER — DROPERIDOL 2.5 MG/ML IJ SOLN: 0.6250 mg | Freq: Once | INTRAMUSCULAR | Status: DC | PRN

## 2023-12-09 MED ORDER — BUPIVACAINE HCL (PF) 0.5 % IJ SOLN
INTRAMUSCULAR | Status: DC | PRN
  Administered 2023-12-09: 20 mL

## 2023-12-09 MED ORDER — PHENYLEPHRINE 80 MCG/ML (10ML) SYRINGE FOR IV PUSH (FOR BLOOD PRESSURE SUPPORT)
PREFILLED_SYRINGE | INTRAVENOUS | Status: DC | PRN
  Administered 2023-12-09 (×2): 80 ug via INTRAVENOUS

## 2023-12-09 MED ORDER — OXYCODONE HCL 5 MG/5ML PO SOLN: 5.0000 mg | Freq: Once | ORAL | Status: DC | PRN

## 2023-12-09 MED ORDER — DEXMEDETOMIDINE HCL IN NACL 80 MCG/20ML IV SOLN
INTRAVENOUS | Status: AC
  Filled 2023-12-09: qty 20

## 2023-12-09 MED ORDER — ORAL CARE MOUTH RINSE: 15.0000 mL | Freq: Once | OROMUCOSAL | Status: AC

## 2023-12-09 MED ORDER — PROPOFOL 500 MG/50ML IV EMUL
INTRAVENOUS | Status: DC | PRN
  Administered 2023-12-09: 100 ug/kg/min via INTRAVENOUS
  Administered 2023-12-09: 50 mg via INTRAVENOUS

## 2023-12-09 MED ORDER — OXYCODONE HCL 5 MG PO TABS: 5.0000 mg | ORAL_TABLET | Freq: Once | ORAL | Status: DC | PRN

## 2023-12-09 MED ORDER — PHENYLEPHRINE 80 MCG/ML (10ML) SYRINGE FOR IV PUSH (FOR BLOOD PRESSURE SUPPORT)
PREFILLED_SYRINGE | INTRAVENOUS | Status: AC
  Filled 2023-12-09: qty 10

## 2023-12-09 MED ORDER — LACTATED RINGERS IV SOLN: INTRAVENOUS | Status: DC

## 2023-12-09 MED ORDER — EPHEDRINE 5 MG/ML INJ
INTRAVENOUS | Status: AC
  Filled 2023-12-09: qty 5

## 2023-12-09 MED ORDER — CEFAZOLIN SODIUM-DEXTROSE 2-4 GM/100ML-% IV SOLN
INTRAVENOUS | Status: AC
  Filled 2023-12-09: qty 100

## 2023-12-09 MED ORDER — CEFAZOLIN SODIUM-DEXTROSE 2-4 GM/100ML-% IV SOLN
2.0000 g | INTRAVENOUS | Status: AC
  Administered 2023-12-09: 2 g via INTRAVENOUS

## 2023-12-09 MED ORDER — LIDOCAINE HCL (PF) 1 % IJ SOLN
INTRAMUSCULAR | Status: AC
  Filled 2023-12-09: qty 30

## 2023-12-09 MED ORDER — HYDROCODONE-ACETAMINOPHEN 5-325 MG PO TABS: 1.0000 | ORAL_TABLET | Freq: Four times a day (QID) | ORAL | 0 refills | Status: AC | PRN

## 2023-12-09 MED ORDER — CHLORHEXIDINE GLUCONATE 0.12 % MT SOLN
15.0000 mL | Freq: Once | OROMUCOSAL | Status: AC
  Administered 2023-12-09: 15 mL via OROMUCOSAL

## 2023-12-09 MED ORDER — 0.9 % SODIUM CHLORIDE (POUR BTL) OPTIME
TOPICAL | Status: DC | PRN
  Administered 2023-12-09: 500 mL

## 2023-12-09 MED ORDER — DEXMEDETOMIDINE HCL IN NACL 80 MCG/20ML IV SOLN
INTRAVENOUS | Status: DC | PRN
  Administered 2023-12-09 (×2): 4 ug via INTRAVENOUS

## 2023-12-09 MED ORDER — BUPIVACAINE HCL (PF) 0.5 % IJ SOLN
INTRAMUSCULAR | Status: AC
  Filled 2023-12-09: qty 30

## 2023-12-09 MED ORDER — ACETAMINOPHEN 10 MG/ML IV SOLN: 1000.0000 mg | Freq: Once | INTRAVENOUS | Status: DC | PRN

## 2023-12-09 SURGICAL SUPPLY — 30 items
BENZOIN TINCTURE PRP APPL 2/3 (GAUZE/BANDAGES/DRESSINGS) ×1 IMPLANT
BLADE MED AGGRESSIVE (BLADE) IMPLANT
BNDG COHESIVE 4X5 TAN STRL LF (GAUZE/BANDAGES/DRESSINGS) ×1 IMPLANT
BNDG ESMARCH 4X12 STRL LF (GAUZE/BANDAGES/DRESSINGS) ×1 IMPLANT
BNDG GAUZE DERMACEA FLUFF 4 (GAUZE/BANDAGES/DRESSINGS) IMPLANT
BNDG STRETCH 4X75 STRL LF (GAUZE/BANDAGES/DRESSINGS) ×1 IMPLANT
CUFF TOURN SGL QUICK 18X4 (TOURNIQUET CUFF) IMPLANT
DURAPREP 26ML APPLICATOR (WOUND CARE) ×1 IMPLANT
ELECTRODE REM PT RTRN 9FT ADLT (ELECTROSURGICAL) ×1 IMPLANT
GAUZE SPONGE 4X4 12PLY STRL (GAUZE/BANDAGES/DRESSINGS) ×1 IMPLANT
GAUZE XEROFORM 1X8 LF (GAUZE/BANDAGES/DRESSINGS) ×1 IMPLANT
GLOVE BIO SURGEON STRL SZ7 (GLOVE) ×1 IMPLANT
GLOVE BIOGEL PI IND STRL 7.5 (GLOVE) ×1 IMPLANT
GOWN STRL REUS W/ TWL LRG LVL3 (GOWN DISPOSABLE) ×2 IMPLANT
KIT TURNOVER KIT A (KITS) ×1 IMPLANT
MANIFOLD NEPTUNE II (INSTRUMENTS) ×1 IMPLANT
NS IRRIG 500ML POUR BTL (IV SOLUTION) ×1 IMPLANT
PACK EXTREMITY ARMC (MISCELLANEOUS) ×1 IMPLANT
PENCIL SMOKE EVACUATOR (MISCELLANEOUS) ×2 IMPLANT
SOLN STERILE WATER 500 ML (IV SOLUTION) ×1 IMPLANT
STOCKINETTE IMPERVIOUS 9X36 MD (GAUZE/BANDAGES/DRESSINGS) ×1 IMPLANT
STRIP CLOSURE SKIN 1/4X4 (GAUZE/BANDAGES/DRESSINGS) ×1 IMPLANT
SUT ETHILON 5-0 FS-2 18 BLK (SUTURE) IMPLANT
SUT VIC AB 0 CT1 36 (SUTURE) IMPLANT
SUT VIC AB 2-0 SH 27XBRD (SUTURE) IMPLANT
SUT VIC AB 3-0 SH 27X BRD (SUTURE) IMPLANT
SUT VICRYL AB 3-0 FS1 BRD 27IN (SUTURE) IMPLANT
SUTURE ETHLN 4-0 FS2 18XMF BLK (SUTURE) IMPLANT
SUTURE MNCRL 4-0 27XMF (SUTURE) IMPLANT
TRAP FLUID SMOKE EVACUATOR (MISCELLANEOUS) ×1 IMPLANT

## 2023-12-09 NOTE — Anesthesia Preprocedure Evaluation (Addendum)
 Anesthesia Evaluation  Patient identified by MRN, date of birth, ID band Patient awake    Reviewed: Allergy & Precautions, NPO status , Patient's Chart, lab work & pertinent test results  History of Anesthesia Complications Negative for: history of anesthetic complications  Airway Mallampati: II  TM Distance: >3 FB Neck ROM: full    Dental  (+) Missing,    Pulmonary asthma , former smoker   Pulmonary exam normal        Cardiovascular negative cardio ROS Normal cardiovascular exam     Neuro/Psych  PSYCHIATRIC DISORDERS  Depression    negative neurological ROS     GI/Hepatic Neg liver ROS,,,Chronic ulcerative colitis without complication   Endo/Other  negative endocrine ROS    Renal/GU negative Renal ROS  negative genitourinary   Musculoskeletal  (+) Arthritis , Rheumatoid disorders,    Abdominal Normal abdominal exam  (+)   Peds  Hematology negative hematology ROS (+)   Anesthesia Other Findings Past Medical History: No date: Asthma No date: Diverticulitis No date: Diverticulosis 11/20/2015: Hemorrhoids     Comment:  internal and external 12/25/2018: History of 2019 novel coronavirus disease (COVID-19) No date: Hyperlipidemia 11/24/2015: Ischemic colitis (HCC) No date: Rheumatoid arthritis (HCC) 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  Past Surgical History: No date: CARDIAC CATHETERIZATION No date: COLONOSCOPY 11/20/2015: COLONOSCOPY WITH PROPOFOL ; N/A     Comment:  Procedure: COLONOSCOPY WITH PROPOFOL ;  Surgeon: Gladis RAYMOND Mariner, MD;  Location: [facility] ENDOSCOPY;  Service:               Endoscopy;  Laterality: N/A; 07/01/2020: COLONOSCOPY WITH PROPOFOL ; N/A     Comment:  Procedure: COLONOSCOPY WITH PROPOFOL ;  Surgeon:               Maryruth Ole DASEN, MD;  Location: [facility] ENDOSCOPY;                Service: Endoscopy;  Laterality: N/A; 08/21/2021: COLONOSCOPY WITH PROPOFOL ; N/A     Comment:   Procedure: COLONOSCOPY WITH PROPOFOL ;  Surgeon:               Maryruth Ole DASEN, MD;  Location: [facility] ENDOSCOPY;                Service: Endoscopy;  Laterality: N/A; No date: ESOPHAGOGASTRODUODENOSCOPY 07/01/2020: ESOPHAGOGASTRODUODENOSCOPY (EGD) WITH PROPOFOL ; N/A     Comment:  Procedure: ESOPHAGOGASTRODUODENOSCOPY (EGD) WITH               PROPOFOL ;  Surgeon: Maryruth Ole DASEN, MD;  Location:               [facility] ENDOSCOPY;  Service: Endoscopy;  Laterality: N/A;                COVID POSITIVE 04/14/2020 IN E.D. 08/21/2021: ESOPHAGOGASTRODUODENOSCOPY (EGD) WITH PROPOFOL ; N/A     Comment:  Procedure: ESOPHAGOGASTRODUODENOSCOPY (EGD) WITH               PROPOFOL ;  Surgeon: Maryruth Ole DASEN, MD;  Location:               [facility] ENDOSCOPY;  Service: Endoscopy;  Laterality: N/A; 11/24/2015: PERIPHERAL VASCULAR CATHETERIZATION; N/A     Comment:  Procedure: Visceral Angiography;  Surgeon: Selinda GORMAN Gu,               MD;  Location: [facility] INVASIVE CV LAB;  Service:  Cardiovascular;  Laterality: N/A; No date: VAGINAL HYSTERECTOMY  BMI    Body Mass Index: 24.69 kg/m      Reproductive/Obstetrics negative OB ROS                              Anesthesia Physical Anesthesia Plan  ASA: 2  Anesthesia Plan: General   Post-op Pain Management: Regional block*   Induction: Intravenous  PONV Risk Score and Plan: 2 and Propofol  infusion and TIVA  Airway Management Planned: Natural Airway and Nasal Cannula  Additional Equipment:   Intra-op Plan:   Post-operative Plan:   Informed Consent: I have reviewed the patients History and Physical, chart, labs and discussed the procedure including the risks, benefits and alternatives for the proposed anesthesia with the patient or authorized representative who has indicated his/her understanding and acceptance.     Dental Advisory Given  Plan Discussed with: Anesthesiologist, CRNA and Surgeon  Anesthesia Plan  Comments:          Anesthesia Quick Evaluation

## 2023-12-09 NOTE — Op Note (Signed)
 PODIATRY / FOOT AND ANKLE SURGERY OPERATIVE REPORT    SURGEON: Prentice Lee, DPM  PRE-OPERATIVE DIAGNOSIS:  1.  Plantarflexed left fifth metatarsal 2.  Contracture left fifth toe 3.  Arthritic left fifth metatarsophalangeal joint  POST-OPERATIVE DIAGNOSIS: Same  PROCEDURE(S): Left fifth metatarsal partial head resection Left fifth digit capsular tendon balancing at the metatarsal phalangeal joint  HEMOSTASIS: Left ankle tourniquet  ANESTHESIA: MAC  ESTIMATED BLOOD LOSS: 5 cc  FINDING(S): 1.  Arthritic changes of the left fifth metatarsophalangeal joint  PATHOLOGY/SPECIMEN(S): None  INDICATIONS:   Nicole Turner is a 68 y.o. female who presents with arthritic joint pain and plantarflexed fifth metatarsal causing callus subfifth metatarsal phalange joint which causes pain discomfort for patient.  Patient has exhausted conservative measures and presents today for surgical evaluation and intervention.  All treatment options were discussed with the patient both conservative and surgical attempts at correction clip attention risks and complications at this time patient is elected for procedure today consisting of left fifth metatarsal partial head resection versus plantar condylectomy and capsular tendon balancing to the fifth metatarsal phalange joint.  No guarantees given.  Consent obtained prior to procedure..  DESCRIPTION: After obtaining full informed written consent, the patient was brought back to the operating room and placed supine upon the operating table.  The patient received IV antibiotics prior to induction.  After obtaining adequate anesthesia, the patient was prepped and draped in the standard fashion.  20 cc of half percent Marcaine  plain was injected at the left fifth ray.  Attention was directed to the left fifth metatarsal phalangeal joint where a linear longitudinal incision was made lateral to the tendon of the extensor digitorum longus involve the contour of the  mild tailor's bunion.  The incision was deepened through the subcutaneous tissues utilizing sharp and blunt dissection and care was taken to identify and retract all vital neural and vascular structures no venous contributories were cauterized as necessary.  At this time a capsular and periosteal incision was made into the fifth metatarsal phalange joint at the operative site lateral to the tendon of the EDL.  The capsular and periosteal tissue was reflected medially and laterally thereby exposing the fifth metatarsal phalangeal joint at the operative site.  A McGlamery elevator was used to open the joint surface up due to the arthritic changes present to the joint and the digit appeared to be dorsally deviated at the dorsal aspect of the joint due to an extensor tendon contracture.  At this time a Z-lengthening was then performed of the extensor tendon and the tendon was reapproximated with 3-0 Vicryl in a running interconnected stitch holding the toe in a rectus position while doing so.  This appeared to drop the fifth toe down into a relocated position in rectus position overall.  This made it more easily able to get into the fifth metatarsal phalangeal joint.  The lateral eminence was resected.  The bone appeared to be very soft and the articular cartilage appeared to be significantly diseased at the fifth metatarsal phalange joint level.  Due to the significance of the arthritic changes it was appropriate to perform a partial fifth metatarsal head resection.  This was done with the appropriate beveling leaving more dorsal than plantar.  The metatarsal head was resected and passed off the operative site.  Any rough edges were then smoothed with a rongeur.  The surgical site was flushed with copious amounts normal sterile saline.  C-arm imaging was used to verify resection of the  bone which appeared to be adequate.  The fifth toe appeared to sit in rectus position.  The capsular and periosteal structures were  reapproximated well coapted with 3-0 Vicryl.  The subcutaneous tissue was reapproximated well coapted with 4-0 Monocryl.  The skin was then reapproximated well coapted with 4-0 nylon horizontal mattress type stitching.  The pneumatic ankle tourniquet was deflated and a prompt hyperemic response was noted all digits left foot.  A postoperative dressing was then applied consisting of Xeroform followed by 4 x 4 gauze, gauze roll, Ace wrap.  Following a period of postoperative monitoring the patient be discharged home with the appropriate orders, instructions, and medications.  COMPLICATIONS: None  CONDITION: Good, stable  Prentice Lee, DPM

## 2023-12-09 NOTE — H&P (Signed)
 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  The various methods of treatment have been discussed with the patient.  No guarantees were given.  After consideration of risks, benefits and other options for treatment, the patient has consented to surgery.  I have reviewed the patients' chart and labs.    PROCEDURE: LEFT 5TH METATARSAL PLANTAR CONDYLECTOMY VS PARTIAL METATARSAL HEAD RESECTION LEFT 5TH MTPJ CAPSULOTENDON BALANCING  A history and physical examination was performed in my office.  The patient was reexamined.  There have been no changes to this history and physical examination.  Prentice Lee, DPM

## 2023-12-09 NOTE — Transfer of Care (Signed)
 Immediate Anesthesia Transfer of Care Note  Patient: Nicole Turner  Procedure(s) Performed: LEFT FIFTH METATARSAL PLANTAR CONDYLECTOMY WITH CAPSULOTENDON BALANCING (Left: Fifth Toe)  Patient Location: PACU  Anesthesia Type:General  Level of Consciousness: drowsy  Airway & Oxygen Therapy: Patient Spontanous Breathing and Patient connected to face mask oxygen  Post-op Assessment: Report given to RN and Post -op Vital signs reviewed and stable  Post vital signs: Reviewed and stable  Last Vitals:  Vitals Value Taken Time  BP 135/58 12/09/23 15:40  Temp 36.3 C 12/09/23 15:37  Pulse 50 12/09/23 15:40  Resp 17 12/09/23 15:40  SpO2 100 % 12/09/23 15:40    Last Pain:  Vitals:   12/09/23 1537  TempSrc:   PainSc: Asleep         Complications: There were no known notable events for this encounter.

## 2023-12-10 NOTE — Anesthesia Postprocedure Evaluation (Signed)
 Anesthesia Post Note  Patient: Nicole Turner  Procedure(s) Performed: LEFT FIFTH METATARSAL PLANTAR CONDYLECTOMY WITH CAPSULOTENDON BALANCING (Left: Fifth Toe)  Patient location during evaluation: PACU Anesthesia Type: General Level of consciousness: awake and alert Pain management: pain level controlled Vital Signs Assessment: post-procedure vital signs reviewed and stable Respiratory status: spontaneous breathing, nonlabored ventilation and respiratory function stable Cardiovascular status: blood pressure returned to baseline and stable Postop Assessment: no apparent nausea or vomiting Anesthetic complications: no   There were no known notable events for this encounter.   Last Vitals:  Vitals:   12/09/23 1645 12/09/23 1712  BP: (!) 113/53 (!) 119/57  Pulse: (!) 53 (!) 56  Resp: 15 17  Temp:  (!) 36.1 C  SpO2: 100% 99%    Last Pain:  Vitals:   12/09/23 1712  TempSrc: Temporal  PainSc: 0-No pain                 Camellia Merilee Louder

## 2023-12-11 ENCOUNTER — Encounter: Payer: Self-pay | Admitting: Podiatry

## 2024-01-09 ENCOUNTER — Ambulatory Visit
Admission: RE | Admit: 2024-01-09 | Discharge: 2024-01-09 | Disposition: A | Discharge status: Home / Self Care | Source: Ambulatory Visit | Attending: Internal Medicine | Admitting: Internal Medicine

## 2024-01-09 DIAGNOSIS — Z1231 Encounter for screening mammogram for malignant neoplasm of breast: Secondary | ICD-10-CM | POA: Insufficient documentation

## 2024-01-13 ENCOUNTER — Other Ambulatory Visit: Payer: Self-pay | Admitting: Internal Medicine

## 2024-01-13 ENCOUNTER — Encounter: Payer: Self-pay | Admitting: Internal Medicine

## 2024-01-13 DIAGNOSIS — R928 Other abnormal and inconclusive findings on diagnostic imaging of breast: Secondary | ICD-10-CM

## 2024-01-18 ENCOUNTER — Inpatient Hospital Stay: Admission: RE | Admit: 2024-01-18 | Discharge: 2024-01-18 | Attending: Internal Medicine | Admitting: Internal Medicine

## 2024-01-18 ENCOUNTER — Ambulatory Visit
Admission: RE | Admit: 2024-01-18 | Discharge: 2024-01-18 | Disposition: A | Discharge status: Home / Self Care | Source: Ambulatory Visit | Attending: Internal Medicine | Admitting: Internal Medicine

## 2024-01-18 ENCOUNTER — Other Ambulatory Visit: Payer: Self-pay | Admitting: Internal Medicine

## 2024-01-18 DIAGNOSIS — R928 Other abnormal and inconclusive findings on diagnostic imaging of breast: Secondary | ICD-10-CM | POA: Insufficient documentation

## 2024-01-20 ENCOUNTER — Ambulatory Visit
Admission: RE | Admit: 2024-01-20 | Discharge: 2024-01-20 | Disposition: A | Discharge status: Home / Self Care | Source: Ambulatory Visit | Attending: Internal Medicine | Admitting: Internal Medicine

## 2024-01-20 DIAGNOSIS — R928 Other abnormal and inconclusive findings on diagnostic imaging of breast: Secondary | ICD-10-CM | POA: Insufficient documentation

## 2024-01-20 HISTORY — PX: BREAST BIOPSY: SHX20

## 2024-01-20 MED ORDER — LIDOCAINE-EPINEPHRINE 1 %-1:100000 IJ SOLN
20.0000 mL | Freq: Once | INTRAMUSCULAR | Status: AC
  Administered 2024-01-20: 20 mL

## 2024-01-20 MED ORDER — LIDOCAINE 1 % OPTIME INJ - NO CHARGE
5.0000 mL | Freq: Once | INTRAMUSCULAR | Status: AC
  Administered 2024-01-20: 5 mL
  Filled 2024-01-20: qty 6

## 2024-01-23 LAB — SURGICAL PATHOLOGY
